# Patient Record
Sex: Male | Born: 1990 | Race: White | Hispanic: No | Marital: Married | Smoking: Never smoker
Health system: Southern US, Community
[De-identification: ages and names within clinical notes are randomized; demographics above are authoritative.]

## PROBLEM LIST (undated history)

## (undated) DIAGNOSIS — F909 Attention-deficit hyperactivity disorder, unspecified type: Secondary | ICD-10-CM

## (undated) DIAGNOSIS — Z87898 Personal history of other specified conditions: Secondary | ICD-10-CM

## (undated) DIAGNOSIS — N201 Calculus of ureter: Secondary | ICD-10-CM

## (undated) DIAGNOSIS — Z973 Presence of spectacles and contact lenses: Secondary | ICD-10-CM

## (undated) DIAGNOSIS — K219 Gastro-esophageal reflux disease without esophagitis: Secondary | ICD-10-CM

## (undated) HISTORY — DX: Attention-deficit hyperactivity disorder, unspecified type: F90.9

## (undated) HISTORY — PX: EXTRACORPOREAL SHOCK WAVE LITHOTRIPSY: SHX1557

---
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
## 2010-11-01 ENCOUNTER — Encounter: Payer: Self-pay | Admitting: Urology

## 2011-02-26 NOTE — H&P (Signed)
NAME:  Chris Murphy, Chris Murphy             ACCOUNT NO.:  [id number]   MEDICAL RECORD NO.:  [id number]          PATIENT TYPE:  AMB   LOCATION:  DAY                           FACILITY:  [facility]   PHYSICIAN:  Dennie Maizes, M.D.   DATE OF BIRTH:  1991-01-15   DATE OF ADMISSION:  07/08/2004  DATE OF DISCHARGE:  LH                                HISTORY & PHYSICAL   CHIEF COMPLAINT:  Right flank pain, mild hematuria.   HISTORY OF PRESENT ILLNESS:  This 20 year old boy was referred to me by Dr.  Stephania Fragmin.  He had been having intermittent right flank pain of moderate severity  for several months.  He also has past dark-colored urine.  He has urinary  frequency x4, and nocturia x0-1.  There is no history of fever, chills or  dysuria.  Evaluation has been done with non-contrast CT of the abdomen and  pelvis as well as plain x-ray of the KUB area.  There are serial bilateral  renal calculi.  The patient has a 6- x 8-mm sized right renal calculus.  He  has intermittent right flank pain associated with the calculus.  He was  brought to the day hospital today for ESWL of the right renal calculus.   PAST MEDICAL HISTORY:  1.  History of attention-deficit disorder.  2.  ACL injury on the right knee.   MEDICATIONS:  1.  Concerta.  2.  Claritin.   ALLERGIES:  None.   OPERATIONS:  None.   FAMILY HISTORY:  Unremarkable.   PHYSICAL EXAMINATION:  VITAL SIGNS:  Height 5 feet, 9 inches.  Weight 131  pounds.  HEENT:  Head, eyes, ears, nose and throat normal.  NECK:  No masses.  LUNGS:  Clear to auscultation.  HEART:  Regular rate and rhythm, no murmur.  ABDOMEN:  Soft, no palpable flank mass.  Mild right costovertebral angle  tenderness is noted.  Bladder is not palpable.  GU:  Penis and testes are normal.   IMPRESSION:  1.  Right flank pain.  2.  Hematuria.  3.  Right renal calculus.   PLAN:  1.  Extracorporeal shockwave lithotripsy of right renal calculus with IV      sedation, in the hospital.  2.   I discussed with the patient and his mother regarding the diagnosis,      operative details, alternative treatments, outcome, possible risks and      complications, and they are agreeable for the procedure to be done.      SK/MEDQ  D:  07/07/2004  T:  07/07/2004  Job:  [id number]   cc:   Francoise Schaumann. Halm, D.O.  [address], [address]  [place]  [place] [address]  Fax: [phone]   [facility]  Fax: [phone]

## 2011-02-26 NOTE — Discharge Summary (Signed)
NAME:  Chris Murphy, Chris Murphy             ACCOUNT NO.:  [id number]   MEDICAL RECORD NO.:  [id number]          PATIENT TYPE:  INP   LOCATION:  A316                          FACILITY:  [facility]   PHYSICIAN:  Scott A. Gerda Diss, MD    DATE OF BIRTH:  1991-08-04   DATE OF ADMISSION:  01/29/2005  DATE OF DISCHARGE:  04/22/2006LH                                 DISCHARGE SUMMARY   DISCHARGE DIAGNOSES:  1.  Vomiting secondary to reflux.  2.  Seizures.   Patient had a seizure that happened four separate times spaced about over a  3-hour span, each seizure lasted about 3-4 minutes with severe postictal  phase in between the seizures.  There was some question whether or not this  might have been just one long prolonged atypical seizure.  He has not had  any head injury recently, he has not had headaches recently, no fevers, no  tick bites.  He was admitted in by Dr. Milinda Cave and on Saturday he was stable  without seizure.  His CT scan did not show any lesions.  EEG was unable to  be done because of unavailability of test.  Long discussion held with family  regarding what to do for a seizure.  They felt very comfortable in taking  him home and treating him accordingly if he did have a seizure by bringing  him back via EMS to the [facility].  They also have an outpatient  appointment with Dr. Gerilyn Pilgrim on Tuesday.  They were told that they will  probably need to get an EEG and they may well also have to get an MRI done  and then based on that they would decide long-term medications.  They were  also instructed to follow up with Dr. Milinda Cave within the next 2 weeks.  Also it should be noted on his right leg he had a small dot with a little  red around it about the size of a pencil eraser.  I told them to watch this.  If it becomes a target lesion certainly Lyme's related illness would need to  be covered for.  We will leave that up to Dr. Milinda Cave and Dr. Gerilyn Pilgrim.      SAL/MEDQ  D:  01/31/2005  T:   01/31/2005  Job:  [id number]   cc:   Darleen Crocker A. Gerilyn Pilgrim, M.D.  [address] [place]  [place]  [place] [address]  Fax: [phone]   Jeoffrey Massed, MD  [address]  [place]  [place] [address]  Fax: [phone]

## 2011-02-26 NOTE — Discharge Summary (Signed)
NAME:  Chris Murphy, Chris Murphy NO.:  [id number]   MEDICAL RECORD NO.:  [id number]          PATIENT TYPE:  INP   LOCATION:  A316                          FACILITY:  [facility]   PHYSICIAN:  Jeoffrey Massed, MD  DATE OF BIRTH:  Oct 01, 1991   DATE OF ADMISSION:  01/29/2005  DATE OF DISCHARGE:  04/22/2006LH                                 DISCHARGE SUMMARY   ADMISSION DIAGNOSIS:  Suspected seizure.   DISCHARGE DIAGNOSIS:  Suspected seizure.   DISCHARGE MEDICATIONS:  None.   CONSULTATIONS:  None.   PROCEDURE:  None.   HISTORY OF PRESENT ILLNESS:  For complete history and physical, please see  dictated history and physical in chart. Briefly, this is a 20 year old white  male who presented to the emergency department with his parents for having  brief periods of convulsive activity in the midst of a vomiting episode. He  was noted to be drowsy and confused after these episodes. He was evaluated  in the emergency department where his vital signs were normal and he was at  that time, alert and oriented to person, place, time and situation. He had a  normal neurologic examination and had complete blood work and CT scan of the  head. This evaluation did not reveal any abnormalities with the exception of  a white blood cell count of 18,000. He was admitted for observation for  suspected seizures.   HOSPITAL COURSE:  The patient was admitted to 3-A and placed on seizure  precautions and continued on his home medications of Ritalin LA and a proton  pump inhibitor. He was not placed on any anti-seizure medications. He felt  normal throughout the hospitalization and had no seizure activity. He ate  normally. He was discharged home the day after admission and parents were  offered Dia-STAT to use as needed for prolonged seizure activity but they  declined this. During the hospitalization an EEG was ordered but apparently  there is no technician to do this at [facility] anymore.  Therefore,  the EEG was set up to be done at Dr. Ronal Fear office. He has a neurology  consultation at Dr. Ronal Fear office on the morning of February 02, 2005.      PHM/MEDQ  D:  02/01/2005  T:  02/01/2005  Job:  [id number]   cc:   Darleen Crocker A. Gerilyn Pilgrim, M.D.  [address] [place]  [place]  [place] [address]  Fax: [phone]

## 2011-02-26 NOTE — H&P (Signed)
NAME:  Chris Murphy, Chris Murphy NO.:  [id number]   MEDICAL RECORD NO.:  [id number]          PATIENT TYPE:  AMB   LOCATION:  DAY                           FACILITY:  [facility]   PHYSICIAN:  Dennie Maizes, M.D.   DATE OF BIRTH:  12-01-1990   DATE OF ADMISSION:  07/07/2005  DATE OF DISCHARGE:  LH                                HISTORY & PHYSICAL   CHIEF COMPLAINT:  Intermittent left flank pain, left renal calculi.   HISTORY OF PRESENT ILLNESS:  This 20 year old male has history of recurrent  urolithiasis.  He has undergone ESWL for right renal calculus last year.  Has been having intermittent left flank pain.  His recent x-ray revealed two  stones in the left kidney measuring 6 mm and 5 mm in size.  The patient  denied having any fever, chills, voiding difficulty or hematuria.  He is  brought to short-stay center today for extracorporeal shock wave lithotripsy  of left renal calculus.   PAST MEDICAL HISTORY:  1.  History of attention deficit disorder.  2.  History of injury to the right knee.  3.  Status post ESWL of right renal calculus in September 2005.   MEDICATIONS:  Concerta and Claritin.   ALLERGIES:  None.   FAMILY HISTORY:  Unremarkable.   PHYSICAL EXAMINATION:  VITAL SIGNS:  Height 5 feet 9 inches, weight 131  pounds.  HEENT:  Normal.  NECK:  No masses.  LUNGS:  Clear to auscultation.  HEART:  Regular rate and rhythm. No murmurs.  ABDOMEN:  Soft.  No palpable flank masses.  No costovertebral angle  tenderness.  Bladder is not palpable.  Penis and testes are normal.   IMPRESSION:  Left renal calculi, left flank pain.   PLAN:  1.  Extracorporeal shock wave lithotripsy of the left renal calculi with      monitored anesthesia care in short-stay center.  I have discussed with      the patient and his mother regarding the diagnosis, operative details,      alternate treatments, outcome, possible risks and complications and they      have agreed for the procedure  to be done.      Dennie Maizes, M.D.  Electronically Signed     SK/MEDQ  D:  07/07/2005  T:  07/07/2005  Job:  [id number]   cc:   Francoise Schaumann. Halford Chessman  Fax: [phone]

## 2011-02-26 NOTE — H&P (Signed)
NAME:  BARNES, FLOREK NO.:  [id number]   MEDICAL RECORD NO.:  [id number]          PATIENT TYPE:  INP   LOCATION:  A316                          FACILITY:  [facility]   PHYSICIAN:  Jeoffrey Massed, MD  DATE OF BIRTH:  [birth year]   DATE OF ADMISSION:  01/29/2005  DATE OF DISCHARGE:  LH                                HISTORY & PHYSICAL   CHIEF COMPLAINT:  Seizure.   PRIMARY CARE PHYSICIAN:  Francoise Schaumann. Halm, DO, FAAP, FACOP/Philip Hoover Browns,  M.D.   HISTORY OF PRESENT ILLNESS:  Chris Murphy is a 20 year old white male with a  history of severe gastroesophageal reflux disease who was in his normal  state of health until approximately 3:45 a.m. the morning of presentation.  At that point, he began having one of his typical episodes of reflux which  consisted of regurgitation of fluid into the mouth, and vomiting, and  retching.  He was having abdominal pain during the retching.  During these  episodes of retching, he was noted to have distinct episodes during which he  stiffened his entire body, turned both wrists inward, and clenched his  fists, and had some generalized shaking activity, and his eyes went back in  his head.  He was sitting on the couch at the time these occurred, and his  mother says that last approximately 10-20 seconds.  There were about 10-15  minutes between episodes and a total number of episodes was four. After each  episode, he was confused and did not remember what happened.  He was, in  fact, slow in his thinking and slightly lethargic on presentation to the  emergency department.   REVIEW OF SYSTEMS:  No fevers, no headaches, no recent URI or cough.  No  diarrhea or constipation.  No rash.  No recent travel.  No joint or muscle  aches.  He did have an insect bite on his distal right anterior thigh.   PAST MEDICAL HISTORY:  1.  NO HISTORY OF SEIZURE DISORDER.  2.  ADHD, well-controlled on Ritalin LA 90 mg q.d. for the last 2 years.  3.  Severe  GERD, relatively well controlled on Prevacid 30 mg per day.  4.  Kidney stones, asymptomatic since lithotripsy on the right side in      September 2005.   PAST SURGICAL HISTORY:  1.  Right knee surgery several years ago, secondary to sports injury.  2.  Lithotripsy, September 2005.   MEDICATIONS:  1.  Ritalin LA 30 mg tabs, 3 tablets in the each morning.  2.  Prevacid 30 mg p.o. q.d.   ALLERGIES:  No known drug allergies.   SOCIAL HISTORY:  Lives with his parents in [place] and he does not used  tobacco, alcohol or drugs.   FAMILY HISTORY:  No history of seizure disorder.   PHYSICAL EXAM:  VITAL SIGNS:  Temperature normal as were the other vital  signs.  GENERAL:  He was alert and oriented to person, place, time, and situation.  He demonstrated lucid thinking.  HEENT:  Pupils were equal, round, react to light accommodation.  His  extraocular movements are intact. There is no scleral icterus or injection.  Tympanic membranes with good light reflex and landmarks bilaterally.  His  nasal passages are patent bilaterally without drainage.  His oropharynx  reveals pink, moist mucosa without lesion or swelling.  Face is notable for  moderately severe, comedonal acne.  NECK:  Supple without thyromegaly or lymphadenopathy.  There is no neck  tenderness or rigidity.  LUNGS:  Clear to auscultation bilaterally with non labored respirations.  CARDIOVASCULAR:  Shows a regular rhythm and rate without murmur, rub or  gallop.  ABDOMEN:  Soft, nontender, nondistended.  His bowel sounds are normoactive  and there is no hepatosplenomegaly or mass.  EXTREMITIES:  Show no clubbing, cyanosis, or edema.  SKIN:  Shows no rash.   LABS:  CBC was normal with the exception of a white blood cell count of  about 18,000 with slightly elevated neutrophil percentage.  C-MET within  normal limits.  CT scan of the head was normal   ASSESSMENT:  Seizures, appear idiopathic at this point.   PLAN:  1.   Admit for observation.  2.  Setup EEG and arrange for outpatient neurologic consultation.  3.  Will continue Ritalin in the hospital, but the possible lowering of the      seizure threshold by this medication was discussed with the parents in      depth.      PHM/MEDQ  D:  01/29/2005  T:  01/29/2005  Job:  [id number]

## 2011-02-26 NOTE — Op Note (Signed)
[facility]  Patient:    Chris Murphy, Chris Murphy Visit Number: [id number] MRN: [id number]          Service Type: DSU Location: [facility] Attending Physician:  Colbert Ewing Dictated by:   Loreta Ave, M.D. Proc. Date: 06/28/01 Admit Date:  06/28/2001                             Operative Report  PREOPERATIVE DIAGNOSIS:  Partial anterior cruciate ligament tear with locked right knee.  POSTOPERATIVE DIAGNOSIS:  Partial anterior cruciate ligament tear with locked right knee with anterior synovitis, flap tear posterior horn, lateral meniscus, and traumatic chondral abrasion medial femoral condyle.  Partial ACL strain injury without resulting significant instability.  OPERATION PERFORMED:  Right knee examination under anesthesia, arthroscopy with partial synovectomy, debridement, lateral meniscus, assessment debridement of partial anterior cruciate ligament tear.  Chondroplasty of medial femoral condyle.  SURGEON:  Loreta Ave, M.D.  ASSISTANT:  Arlys John D. Petrarca, P.A.-C.  ANESTHESIA:  General.  ESTIMATED BLOOD LOSS:  Minimal.  TOURNIQUET TIME:  None.  ____________  CULTURES:  None.  COMPLICATIONS:  None.  DRESSING:  Soft compressive with knee immobilizer in full extension.  DESCRIPTION OF PROCEDURE:  The patient was brought to the operating room and after adequate anesthesia had been obtained, the right knee examined.  Preop 15 degree flexion contracture was easily correctable to about five degrees in full extension, still with a soft end point.  Negative Lachman, negative drawer with good end point.  Other ligaments stable in full flexion. Tourniquet and leg holder applied.  Leg prepped and draped in the usual sterile fashion.  Three portals created, one superolateral, one each medial and lateral parapatellar.  Inflow catheter introduced, knee distended. Arthroscope introduced, knee inspected.  Posttraumatic synovitis which  was the main block to extension.  All of this cleared out from the front of the knee. Midsubstance anterior cruciate ligament, partial stretch in the midsubstance. Some small flaps off of this debrided.  More than 80% of the ligament remaining with good integrity and end point.  No further anterior impingement after synovectomy and debridement of ACL.  Lateral meniscus and a small flap tear off the posterior horn debrided out with shaver tapered in smoothly.  The medial femoral condyle had a very small focal grade 2 chondral flap, traumatic in nature.  This was debrided with a chondroplasty to a stable surface. For the most part,  remaining articular cartilage excellent and very little loss of articular cartilage from his injury.  Medial meniscus without tears.  PCL intact.  Good patellofemoral tracking.  Entire knee examined, no other significant findings appreciated.  Nice full easy extension, slight hyperextension and completion which matched the other knee.  After completion and instruments and fluid removed, the knee was injected with Marcaine. Portals closed with nylon.  Sterile compressive dressing applied.  Leg holder removed.  Knee immobilizer to hold him in full extension.  Anesthesia reversed.  Brought to recovery room.  Tolerated surgery well.  No complications. Dictated by:   Loreta Ave, M.D. Attending Physician:  Colbert Ewing DD:  06/28/01 TD:  06/28/01 Job: [id number]

## 2013-03-08 ENCOUNTER — Encounter: Payer: Self-pay | Admitting: *Deleted

## 2013-03-16 ENCOUNTER — Encounter: Payer: Self-pay | Admitting: Family Medicine

## 2013-03-16 ENCOUNTER — Ambulatory Visit (INDEPENDENT_AMBULATORY_CARE_PROVIDER_SITE_OTHER): Payer: BC Managed Care – PPO | Admitting: Family Medicine

## 2013-03-16 ENCOUNTER — Ambulatory Visit: Payer: Self-pay | Admitting: Family Medicine

## 2013-03-16 VITALS — BP 118/80 | HR 70 | Wt 256.0 lb

## 2013-03-16 DIAGNOSIS — F909 Attention-deficit hyperactivity disorder, unspecified type: Secondary | ICD-10-CM

## 2013-03-16 NOTE — Patient Instructions (Signed)
Take all medication regularly

## 2013-03-16 NOTE — Progress Notes (Signed)
  Subjective:    Patient ID: Chris Murphy, male    DOB: Feb 02, 1991, 22 y.o.   MRN: [id number]  HPI Patient states overall doing well with medication. No obvious side effects. Definitely helping him. He takes on weekends 2. Notes improved concentration. States he definitely needs to stay on it.   Review of Systems No chest pain no abdominal pain no shortness of breath review systems otherwise negative    Objective:   Physical Exam Alert no acute distress. HEENT normal. Lungs clear. Heart regular rate and rhythm. Neuro intact.       Assessment & Plan:  Impression ADHD good control. Plan recheck in 4 months. 4 months worth of medications written. WSL

## 2013-03-18 DIAGNOSIS — F909 Attention-deficit hyperactivity disorder, unspecified type: Secondary | ICD-10-CM | POA: Insufficient documentation

## 2013-07-02 ENCOUNTER — Telehealth: Payer: Self-pay | Admitting: Family Medicine

## 2013-07-02 NOTE — Telephone Encounter (Signed)
Pt needs a letter on letterhead stating what they need from the letter from [facility] attached, please call when done.

## 2013-07-06 NOTE — Telephone Encounter (Signed)
Misty Stanley called pt to inform note is ready for pick up, pt understands

## 2013-07-11 ENCOUNTER — Telehealth: Payer: Self-pay | Admitting: Family Medicine

## 2013-07-11 NOTE — Telephone Encounter (Addendum)
Patient advised to contact employer and workers comp doctor for evaluation for fracture

## 2013-07-11 NOTE — Telephone Encounter (Signed)
Patient says yesterday he was working at [employer] and a huge bar fell on his foot. He can walk, but is hobbling. It is swollen and bruised. He would like to find out how he would know if it was broke?

## 2013-07-11 NOTE — Telephone Encounter (Signed)
agree

## 2013-07-19 ENCOUNTER — Encounter: Payer: Self-pay | Admitting: Family Medicine

## 2013-07-19 ENCOUNTER — Ambulatory Visit (INDEPENDENT_AMBULATORY_CARE_PROVIDER_SITE_OTHER): Payer: BC Managed Care – PPO | Admitting: Family Medicine

## 2013-07-19 VITALS — BP 122/82 | Ht 73.0 in | Wt 271.5 lb

## 2013-07-19 DIAGNOSIS — F909 Attention-deficit hyperactivity disorder, unspecified type: Secondary | ICD-10-CM

## 2013-07-19 NOTE — Progress Notes (Signed)
  Subjective:    Patient ID: Chris Murphy, male    DOB: Dec 11, 1990, 22 y.o.   MRN: [id number]  HPI Patient is here today for his ADHD follow up visit. Patient states that he has no concerns and is doing very well. Takes concerta in the late after, tends to lslow down. Overall doing well. Medicine definitely helping. Minimal side effects at this time.  Go the gym twice per wk  Regular exercise,  Not a flu shot person   Review of Systems No headache no chest pain no abdominal pain no diminished energy good appetite ROS otherwise negative    Objective:   Physical Exam  Alert no apparent distress lungs clear. Heart regular in rhythm. H&T normal.      Assessment & Plan:  ADHD good control. Tolerating medicines well. Plan maintain same meds. Prescriptions written out. Recheck as scheduled. WSL

## 2013-09-05 ENCOUNTER — Encounter: Payer: Self-pay | Admitting: Family Medicine

## 2013-09-05 ENCOUNTER — Ambulatory Visit (INDEPENDENT_AMBULATORY_CARE_PROVIDER_SITE_OTHER): Payer: BC Managed Care – PPO | Admitting: Family Medicine

## 2013-09-05 VITALS — BP 114/80 | Ht 73.0 in | Wt 277.0 lb

## 2013-09-05 DIAGNOSIS — T148XXA Other injury of unspecified body region, initial encounter: Secondary | ICD-10-CM

## 2013-09-05 DIAGNOSIS — T07XXXA Unspecified multiple injuries, initial encounter: Secondary | ICD-10-CM

## 2013-09-05 NOTE — Progress Notes (Signed)
   Subjective:    Patient ID: Chris Murphy, male    DOB: 1991-08-18, 22 y.o.   MRN: [id number]  Rash This is a new problem. The current episode started yesterday. The problem is unchanged. The affected locations include the chest, right arm, left arm, abdomen and back. The rash is characterized by redness. He was exposed to nothing. Past treatments include nothing. The treatment provided no relief.   PMH benign patient denies any current sickness illness denies any excessive stress he enjoys his job enjoys his home life   Review of Systems  Skin: Positive for rash.       Objective:   Physical Exam  Unusual scratches on the chest around the neck and upper back none on the face. Patient states he had a disturbing dream a few nights ago he denies being depressed or suicidal or homicidal. He denies any other particular troubles currently      Assessment & Plan:  Scratches-I. believe that he probably did this and during his sleep he doesn't recall any other way of getting it he has not been outside doing any type of activity I told him if he has ongoing troubles to followup. Also if he has ongoing disturbing dreams or ongoing scratching we may consider setting him up with psychology for some counseling I find no evidence of scabies

## 2013-11-21 ENCOUNTER — Encounter: Payer: Self-pay | Admitting: Family Medicine

## 2013-11-21 ENCOUNTER — Ambulatory Visit (INDEPENDENT_AMBULATORY_CARE_PROVIDER_SITE_OTHER): Payer: BC Managed Care – PPO | Admitting: Family Medicine

## 2013-11-21 VITALS — BP 130/80 | Ht 73.0 in | Wt 276.0 lb

## 2013-11-21 DIAGNOSIS — F909 Attention-deficit hyperactivity disorder, unspecified type: Secondary | ICD-10-CM

## 2013-11-21 MED ORDER — METHYLPHENIDATE HCL ER (OSM) 54 MG PO TBCR: 54.0000 mg | EXTENDED_RELEASE_TABLET | ORAL | Status: DC

## 2013-11-21 NOTE — Progress Notes (Signed)
   Subjective:    Patient ID: Chris Murphy, male    DOB: 08-07-1991, 23 y.o.   MRN: [id number]  HPI Patient arrives for an ADHD checkup.  Takes med day and weekend   Patient states he is doing well with no problems or concerns.  And taking regularly  No s e's not any significant  Exercise not as much as usual, once per wk  Review of Systems No chest pain no headache no back pain no change in bowel habits no rash ROS otherwise negative    Objective:   Physical Exam Alert no apparent distress. Vitals stable. HEENT normal. Lungs clear. Heart regular in rhythm. Neuro intact.       Assessment & Plan:  Impression 1 ADHD good control plan meds refilled. Recheck in 4 months. Exercise encourage. WSL

## 2014-03-29 ENCOUNTER — Ambulatory Visit (INDEPENDENT_AMBULATORY_CARE_PROVIDER_SITE_OTHER): Payer: BC Managed Care – PPO | Admitting: Family Medicine

## 2014-03-29 ENCOUNTER — Encounter: Payer: Self-pay | Admitting: Family Medicine

## 2014-03-29 VITALS — BP 122/86 | Ht 73.0 in | Wt 279.0 lb

## 2014-03-29 DIAGNOSIS — F902 Attention-deficit hyperactivity disorder, combined type: Secondary | ICD-10-CM

## 2014-03-29 DIAGNOSIS — F909 Attention-deficit hyperactivity disorder, unspecified type: Secondary | ICD-10-CM

## 2014-03-29 MED ORDER — METHYLPHENIDATE HCL ER (OSM) 54 MG PO TBCR: 54.0000 mg | EXTENDED_RELEASE_TABLET | ORAL | Status: DC

## 2014-03-29 NOTE — Progress Notes (Signed)
   Subjective:    Patient ID: Chris Murphy, male    DOB: 12-Apr-1991, 23 y.o.   MRN: [id number]  HPI Patient is here today for a med refill on his Concerta.    Attn and focucing   Takes on the weekends  No notoiceable s es  Exercise has recently started     Medication is working well for him.   He has no questions/concerns.   Review of Systems No headache no chest pain no back pain no rash no insomnia no abdominal pain    Objective:   Physical Exam Alert no acute distress. Vitals stable. HEENT normal. Lungs clear. Heart regular rate and rhythm. Neuro intact       Assessment & Plan:  Impression ADHD good control discussed plan maintain same meds. Diet exercise discussed. Recheck in 4 months. WSL

## 2014-05-24 ENCOUNTER — Telehealth: Payer: Self-pay | Admitting: Family Medicine

## 2014-05-24 NOTE — Telephone Encounter (Signed)
Error

## 2014-06-03 ENCOUNTER — Telehealth: Payer: Self-pay | Admitting: Family Medicine

## 2014-06-03 NOTE — Telephone Encounter (Signed)
Rx prior auth APPROVED for pt's METHYLPHENIDATE HCl 54mg  (generice Concerta), prior auth good 05/13/14-06/03/15, faxed approval to [facility], notified pt

## 2014-06-06 ENCOUNTER — Ambulatory Visit (INDEPENDENT_AMBULATORY_CARE_PROVIDER_SITE_OTHER): Payer: BC Managed Care – PPO | Admitting: Family Medicine

## 2014-06-06 ENCOUNTER — Encounter: Payer: Self-pay | Admitting: Family Medicine

## 2014-06-06 VITALS — BP 122/70 | Temp 98.9°F | Ht 73.0 in | Wt 276.6 lb

## 2014-06-06 DIAGNOSIS — R300 Dysuria: Secondary | ICD-10-CM

## 2014-06-06 DIAGNOSIS — R1012 Left upper quadrant pain: Secondary | ICD-10-CM

## 2014-06-06 DIAGNOSIS — N200 Calculus of kidney: Secondary | ICD-10-CM | POA: Insufficient documentation

## 2014-06-06 LAB — POCT URINALYSIS DIPSTICK
Spec Grav, UA: 1.015
pH, UA: 6

## 2014-06-06 MED ORDER — ETODOLAC 400 MG PO TABS: 400.0000 mg | ORAL_TABLET | Freq: Two times a day (BID) | ORAL | Status: DC

## 2014-06-06 NOTE — Progress Notes (Signed)
   Subjective:    Patient ID: ALI MOHL, male    DOB: 02-08-1991, 23 y.o.   MRN: [id number]  HPI  Patient arrives with complaint of left side abd pain a few times in last 1.5 months. Patient wonders if it is kidney stones.  After dinner pt experienced horibble abd pain  Pain was severe enough to to er, but on the way vomited and felt better  Then vomitied, and felt better  Then in mid July , [place], bad low abd ache and uncomfortable, no nausea that day  yest pain hit again and severe, lasted a couple of hrs  Came bk again in the eve  Results for orders placed in visit on 06/06/14  POCT URINALYSIS DIPSTICK      Result Value Ref Range   Color, UA       Clarity, UA       Glucose, UA       Bilirubin, UA       Ketones, UA       Spec Grav, UA 1.015     Blood, UA       pH, UA 6.0     Protein, UA       Urobilinogen, UA       Nitrite, UA       Leukocytes, UA       no family history of kidney stones.  All pain occurred on the left side. At times severe. Sometimes associated nausea and vomiting.   Review of Systems No headache no chest pain no fever no chills no major change in urinary habits occasional loose stools ROS otherwise negative.    Objective:   Physical Exam  Alert no apparent distress. Lungs clear. Heart regular in rhythm. H&T normal. Abdomen left lateral abdominal tenderness to deep palpation. Next  Urinalysis 2-4 red blood cells per high-power field      Assessment & Plan:  Impression probable kidney stone doubt ongoing obstruction. Discussed at length. Since CT scan already done do not feel another is warranted at this time. Plan Zofran when necessary patient Selena Batten has some. Increase fluid intake. Screening urine. Anti-inflammatory prescribed. If persists return for further evaluation. WSL

## 2014-06-07 ENCOUNTER — Other Ambulatory Visit: Payer: Self-pay

## 2014-06-07 ENCOUNTER — Telehealth: Payer: Self-pay | Admitting: Family Medicine

## 2014-06-07 DIAGNOSIS — N200 Calculus of kidney: Secondary | ICD-10-CM

## 2014-06-07 NOTE — Telephone Encounter (Signed)
Patient has asked that Dr. Richardson Landry call him back regarding the issue that he spoke with him about earlier.

## 2014-06-12 ENCOUNTER — Encounter: Payer: Self-pay | Admitting: Family Medicine

## 2014-06-25 ENCOUNTER — Other Ambulatory Visit: Payer: Self-pay | Admitting: Urology

## 2014-06-27 ENCOUNTER — Encounter (HOSPITAL_COMMUNITY): Payer: Self-pay | Admitting: *Deleted

## 2014-06-27 ENCOUNTER — Encounter (HOSPITAL_COMMUNITY): Payer: Self-pay | Admitting: Pharmacy Technician

## 2014-07-01 ENCOUNTER — Ambulatory Visit (HOSPITAL_COMMUNITY): Payer: BC Managed Care – PPO

## 2014-07-01 ENCOUNTER — Encounter (HOSPITAL_COMMUNITY): Payer: Self-pay | Admitting: *Deleted

## 2014-07-01 ENCOUNTER — Ambulatory Visit (HOSPITAL_COMMUNITY)
Admission: RE | Admit: 2014-07-01 | Discharge: 2014-07-01 | Disposition: A | Discharge status: Home / Self Care | Payer: BC Managed Care – PPO | Source: Ambulatory Visit | Attending: Urology | Admitting: Urology

## 2014-07-01 ENCOUNTER — Encounter (HOSPITAL_COMMUNITY)
Admission: RE | Disposition: A | Discharge status: Home / Self Care | Payer: Self-pay | Source: Ambulatory Visit | Attending: Urology

## 2014-07-01 DIAGNOSIS — N201 Calculus of ureter: Secondary | ICD-10-CM | POA: Insufficient documentation

## 2014-07-01 DIAGNOSIS — R569 Unspecified convulsions: Secondary | ICD-10-CM | POA: Insufficient documentation

## 2014-07-01 DIAGNOSIS — Z538 Procedure and treatment not carried out for other reasons: Secondary | ICD-10-CM | POA: Diagnosis not present

## 2014-07-01 DIAGNOSIS — R110 Nausea: Secondary | ICD-10-CM | POA: Diagnosis not present

## 2014-07-01 SURGERY — LITHOTRIPSY, ESWL
Anesthesia: LOCAL | Laterality: Left

## 2014-07-01 MED ORDER — CIPROFLOXACIN HCL 500 MG PO TABS
500.0000 mg | ORAL_TABLET | ORAL | Status: AC
  Administered 2014-07-01: 500 mg via ORAL
  Filled 2014-07-01: qty 1

## 2014-07-01 MED ORDER — DIAZEPAM 5 MG PO TABS
10.0000 mg | ORAL_TABLET | ORAL | Status: AC
  Administered 2014-07-01: 10 mg via ORAL
  Filled 2014-07-01: qty 2

## 2014-07-01 MED ORDER — SODIUM CHLORIDE 0.9 % IV SOLN
INTRAVENOUS | Status: DC
  Administered 2014-07-01: 1000 mL via INTRAVENOUS

## 2014-07-01 MED ORDER — DIPHENHYDRAMINE HCL 25 MG PO CAPS
25.0000 mg | ORAL_CAPSULE | ORAL | Status: AC
  Administered 2014-07-01: 25 mg via ORAL
  Filled 2014-07-01: qty 1

## 2014-07-01 NOTE — Progress Notes (Signed)
Received call from [place] on [employer] [employer] truck that Dr Junious Silk wanted to discussion with anesthesia

## 2014-07-01 NOTE — Progress Notes (Signed)
Again spoke with Mom,patients wife and patient and they states that from age [age] patient would have a seizure whenever he vomited but not always,The most recent episode was May 2015 with his kidney stone in which the wife states she witnessed him  "having a seizure when he was vomiting". This was reported to [place] on [place] truck and she was reporting this to Dr Junious Silk and Anesthesia.

## 2014-07-01 NOTE — Progress Notes (Signed)
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 She wanted to call Dr Junious Silk about this.

## 2014-07-01 NOTE — Progress Notes (Addendum)
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

## 2014-07-01 NOTE — Progress Notes (Signed)
Dr Junious Silk here to see patient and family. Pt is canceled and to be rescheduled at a future date

## 2014-07-01 NOTE — H&P (Signed)
  Patient was being considered today for left shockwave lithotripsy of a large ureteral calculus. It became apparent that he had significant issues with nausea leading to seizures. Anesthesia was available for the procedure but recovery room staff was not available. I discussed the situation with the patient, his wife and mother. Discussed alternatives such as ureteroscopy and again he does not want to proceed with endoscopic treatment. He will be scheduled for shockwave lithotripsy earlier during the day with anesthesia when recovery room will be fully staffed for postoperative observation.

## 2014-07-02 ENCOUNTER — Other Ambulatory Visit: Payer: Self-pay | Admitting: Urology

## 2014-07-02 ENCOUNTER — Encounter (HOSPITAL_COMMUNITY): Payer: Self-pay | Admitting: *Deleted

## 2014-07-04 ENCOUNTER — Encounter (HOSPITAL_COMMUNITY): Payer: Self-pay

## 2014-07-04 ENCOUNTER — Encounter (HOSPITAL_COMMUNITY): Payer: BC Managed Care – PPO | Admitting: Anesthesiology

## 2014-07-04 ENCOUNTER — Ambulatory Visit (HOSPITAL_COMMUNITY)
Admission: RE | Admit: 2014-07-04 | Discharge: 2014-07-04 | Disposition: A | Discharge status: Home / Self Care | Payer: BC Managed Care – PPO | Source: Ambulatory Visit | Attending: Urology | Admitting: Urology

## 2014-07-04 ENCOUNTER — Ambulatory Visit (HOSPITAL_COMMUNITY): Payer: BC Managed Care – PPO | Admitting: Anesthesiology

## 2014-07-04 ENCOUNTER — Encounter (HOSPITAL_COMMUNITY)
Admission: RE | Disposition: A | Discharge status: Home / Self Care | Payer: Self-pay | Source: Ambulatory Visit | Attending: Urology

## 2014-07-04 ENCOUNTER — Ambulatory Visit (HOSPITAL_COMMUNITY): Payer: BC Managed Care – PPO

## 2014-07-04 DIAGNOSIS — K219 Gastro-esophageal reflux disease without esophagitis: Secondary | ICD-10-CM | POA: Insufficient documentation

## 2014-07-04 DIAGNOSIS — N201 Calculus of ureter: Secondary | ICD-10-CM | POA: Diagnosis present

## 2014-07-04 SURGERY — LITHOTRIPSY, ESWL
Anesthesia: Monitor Anesthesia Care | Laterality: Left

## 2014-07-04 MED ORDER — LACTATED RINGERS IV SOLN: INTRAVENOUS | Status: DC

## 2014-07-04 MED ORDER — LACTATED RINGERS IV SOLN
INTRAVENOUS | Status: DC
  Administered 2014-07-04: 08:00:00 via INTRAVENOUS

## 2014-07-04 MED ORDER — OXYCODONE-ACETAMINOPHEN 5-325 MG PO TABS: 1.0000 | ORAL_TABLET | ORAL | Status: DC | PRN

## 2014-07-04 MED ORDER — CIPROFLOXACIN HCL 500 MG PO TABS
500.0000 mg | ORAL_TABLET | ORAL | Status: AC
  Administered 2014-07-04: 500 mg via ORAL
  Filled 2014-07-04: qty 1

## 2014-07-04 MED ORDER — SODIUM CHLORIDE 0.9 % IV SOLN
50.0000 mg | Freq: Once | INTRAVENOUS | Status: AC
  Administered 2014-07-04: 50 mg via INTRAVENOUS
  Filled 2014-07-04: qty 5

## 2014-07-04 MED ORDER — DIAZEPAM 5 MG PO TABS: 10.0000 mg | ORAL_TABLET | ORAL | Status: DC

## 2014-07-04 MED ORDER — PROMETHAZINE HCL 25 MG/ML IJ SOLN: 6.2500 mg | INTRAMUSCULAR | Status: DC | PRN

## 2014-07-04 MED ORDER — SODIUM CHLORIDE 0.9 % IV SOLN: INTRAVENOUS | Status: DC

## 2014-07-04 MED ORDER — TAMSULOSIN HCL 0.4 MG PO CAPS: 0.4000 mg | ORAL_CAPSULE | Freq: Every day | ORAL | Status: DC

## 2014-07-04 MED ORDER — DIPHENHYDRAMINE HCL 25 MG PO CAPS: 25.0000 mg | ORAL_CAPSULE | ORAL | Status: DC

## 2014-07-04 MED ORDER — MEPERIDINE HCL 50 MG/ML IJ SOLN: 6.2500 mg | INTRAMUSCULAR | Status: DC | PRN

## 2014-07-04 NOTE — Interval H&P Note (Signed)
History and Physical Interval Note:  07/04/2014 9:00 AM  Chris Murphy  has presented today for surgery, with the diagnosis of left ureteral stone  The various methods of treatment have been discussed with the patient and family. After consideration of risks, benefits and other options for treatment, the patient has consented to  Procedure(s): LEFT EXTRACORPOREAL SHOCK WAVE LITHOTRIPSY (ESWL) (Left) as a surgical intervention .  The patient's history has been reviewed, patient examined, no change in status, stable for surgery.  I have reviewed the patient's chart and labs.  Questions were answered to the patient's satisfaction.     Festus Aloe

## 2014-07-04 NOTE — Discharge Instructions (Signed)

## 2014-07-04 NOTE — Anesthesia Preprocedure Evaluation (Signed)
Anesthesia Evaluation  Patient identified by MRN, date of birth, ID band Patient awake    Reviewed: Allergy & Precautions, H&P , NPO status , Patient's Chart, lab work & pertinent test results  Airway Mallampati: II TM Distance: >3 FB Neck ROM: Full    Dental no notable dental hx.    Pulmonary neg pulmonary ROS,  breath sounds clear to auscultation  Pulmonary exam normal       Cardiovascular negative cardio ROS  Rhythm:Regular Rate:Normal     Neuro/Psych negative neurological ROS  negative psych ROS   GI/Hepatic negative GI ROS, Neg liver ROS,   Endo/Other  negative endocrine ROS  Renal/GU negative Renal ROS  negative genitourinary   Musculoskeletal negative musculoskeletal ROS (+)   Abdominal   Peds negative pediatric ROS (+)  Hematology negative hematology ROS (+)   Anesthesia Other Findings   Reproductive/Obstetrics negative OB ROS                           Anesthesia Physical Anesthesia Plan  ASA: I  Anesthesia Plan: MAC   Post-op Pain Management:    Induction:   Airway Management Planned: Simple Face Mask  Additional Equipment:   Intra-op Plan:   Post-operative Plan:   Informed Consent: I have reviewed the patients History and Physical, chart, labs and discussed the procedure including the risks, benefits and alternatives for the proposed anesthesia with the patient or authorized representative who has indicated his/her understanding and acceptance.   Dental advisory given  Plan Discussed with: CRNA  Anesthesia Plan Comments:         Anesthesia Quick Evaluation

## 2014-07-04 NOTE — H&P (Signed)
History of Present Illness Consult for left ureteral stone referred by Dr. Wolfgang Phoenix.     1- left ureteraL stone - Sept 2015 - about 4 months ago pt developed severe left flank pain. A CT A/P was done in [place], [place] showing a 10 mm left proximal stone at L4. Report says no other stones. There was severe left HUN. Ua showed no bacteria, but bun 18, cr 1.4.     Today, the patient continues to have some intermittent left lower quadrant pain which radiates over into the suprapubic area. He has noticed dark yellow urine but no gross hematuria. No dysuria. He has not seen a stone pass.     The patient is here with his mother who contributed to the history.    2-nephrolithiasis-patient has had 2 prior stone episodes treated with shockwave lithotripsy. He was previously seen by Dr. Maryland Pink.    Past Medical History Problems  1. History of esophageal reflux (V12.79)  Surgical History Problems  1. History of Lithotripsy 2. History of Primary Repair Of Knee Ligament Cruciate Anterior  Current Meds 1. Concerta 54 MG Oral Tablet Extended Release;  Therapy: (Recorded:11Sep2015) to Recorded 2. Excedrin TABS; prn;  Therapy: (Recorded:11Sep2015) to Recorded 3. Multi Vitamin/Minerals TABS;  Therapy: (Recorded:11Sep2015) to Recorded  Allergies Medication  1. No Known Drug Allergies  Family History Problems  1. Family history of hypertension (V17.49) : Father 2. Family history of malignant neoplasm of breast (V16.3) : Maternal Grandmother, Paternal  Grandmother  Social History Problems    Denied: History of Alcohol use   Caffeine use (V49.89)   Never a smoker   Occupation  Review of Systems Genitourinary, constitutional, skin, eye, otolaryngeal, hematologic/lymphatic, cardiovascular, pulmonary, endocrine, musculoskeletal, gastrointestinal, neurological and psychiatric system(s) were reviewed and pertinent findings if present are noted.  Gastrointestinal: constipation.  ENT: sore  throat and sinus problems.  Respiratory: cough.    Vitals Vital Signs [Data Includes: Last 1 Day]  Recorded: 11Sep2015 02:46PM  Height: 6 ft  Weight: 265 lb  BMI Calculated: 35.94 BSA Calculated: 2.4 Blood Pressure: 119 / 79 Temperature: 98.5 F Heart Rate: 76  Physical Exam Constitutional: Well nourished and well developed . No acute distress.  Pulmonary: No respiratory distress and normal respiratory rhythm and effort.  Cardiovascular: Heart rate and rhythm are normal . No peripheral edema.  Neuro/Psych:. Mood and affect are appropriate.    Results/Data Urine [Data Includes: Last 1 Day]   [id number]  COLOR YELLOW   APPEARANCE CLEAR   SPECIFIC GRAVITY 1.020   pH 6.5   GLUCOSE NEG mg/dL  BILIRUBIN NEG   KETONE NEG mg/dL  BLOOD TRACE   PROTEIN NEG mg/dL  UROBILINOGEN 0.2 mg/dL  NITRITE NEG   LEUKOCYTE ESTERASE NEG   SQUAMOUS EPITHELIAL/HPF RARE   WBC 3-6 WBC/hpf  RBC 0-2 RBC/hpf  BACTERIA RARE   CRYSTALS NONE SEEN   CASTS NONE SEEN   Other MUCUS NOTED    Old records or history reviewed:Marland Kitchen    Procedure KUB today-there is an 11 mm opacity over the left sacrum consistent with the prior stone on the CT report. I do not appreciate any other calcifications. The bones appeared normal. The bowel gas pattern appear normal.     Assessment Assessed  1. Ureteral calculus, left (592.1) 2. Bacteriuria, asymptomatic (791.9)  Plan Bacteriuria, asymptomatic  1. URINE CULTURE; Status:In Progress - Specimen/Data Collected;   Done: [id number] Health Maintenance  2. UA With REFLEX; [Do Not Release]; Status:Complete;   Done: [id number] 02:36PM Ureteral calculus, left  3. Follow-up Schedule Surgery Office  Follow-up  Status: Complete  Done: [id number] 4. KUB; Status:Resulted - Requires Verification;   Done: [id number] 12:00AM  Discussion/Summary Left ureteral stone-we discussed the nature and risks and benefits of continued stone passage, left shockwave lithotripsy, left ureteroscopy  with laser lithotripsy and stent placement. All questions answered. I discussed given the stone size and location it may limit the ability to localize and deliver shockwaves which may reduce the success of shockwave lithotripsy. He was adamant that he wants to proceed shockwave lithotripsy. He understands this is considered a staged procedure and that he may need repeat shockwave or ureteroscopy if the stone fails to fragment or the fragments fail to pass. I did send urine for culture as a precaution. We also discussed he should proceed to the emergency department if he develops uncontrolled pain, fevers, chills, nausea, vomiting prior to ESWL.     Signatures Electronically signed by : Festus Aloe, M.D.; Jun 21 2014  4:05PM EST   Addendum: The patient was originally scheduled for Monday but with this condition of nausea leading to possible seizures the decision was made to reschedule him when anesthesia and adequate recovery room staff were available.

## 2014-07-04 NOTE — Op Note (Signed)
Left proximal-mid 10 mm ureteral stone  Procedure: Left ESWL, prone, conscious sedation with CRNA.   See [facility] note for full detail

## 2014-07-08 NOTE — Anesthesia Postprocedure Evaluation (Signed)
  Anesthesia Post-op Note  Patient: Chris Murphy  Procedure(s) Performed: Procedure(s) (LRB): LEFT EXTRACORPOREAL SHOCK WAVE LITHOTRIPSY (ESWL) (Left)  Patient Location: PACU  Anesthesia Type: MAC  Level of Consciousness: awake and alert   Airway and Oxygen Therapy: Patient Spontanous Breathing  Post-op Pain: mild  Post-op Assessment: Post-op Vital signs reviewed, Patient's Cardiovascular Status Stable, Respiratory Function Stable, Patent Airway and No signs of Nausea or vomiting  Last Vitals:  Filed Vitals:   07/04/14 1050  BP: 115/72  Pulse: 76  Temp: 36.8 C  Resp: 18    Post-op Vital Signs: stable   Complications: No apparent anesthesia complications

## 2014-08-12 ENCOUNTER — Encounter: Payer: Self-pay | Admitting: Family Medicine

## 2014-08-12 ENCOUNTER — Ambulatory Visit (INDEPENDENT_AMBULATORY_CARE_PROVIDER_SITE_OTHER): Payer: BC Managed Care – PPO | Admitting: Family Medicine

## 2014-08-12 VITALS — BP 118/72 | Ht 73.0 in | Wt 276.0 lb

## 2014-08-12 DIAGNOSIS — F900 Attention-deficit hyperactivity disorder, predominantly inattentive type: Secondary | ICD-10-CM

## 2014-08-12 MED ORDER — METHYLPHENIDATE HCL ER (OSM) 54 MG PO TBCR: 54.0000 mg | EXTENDED_RELEASE_TABLET | ORAL | Status: DC

## 2014-08-12 NOTE — Progress Notes (Signed)
   Subjective:    Patient ID: Chris Murphy, male    DOB: 05-Mar-1991, 23 y.o.   MRN: [id number]  HPI Patient is here today for ADHD check up.  Helping as far as the med  No noticeable side effcts  He needs a refill on his med.  No other concerns.  Also currently don't challenges of kidney stone. Received lithotriptor to therapy but not curative. May need another one soon. Had another attack this weekend   Review of Systems No headache no chest pain no vomiting    Objective:   Physical Exam Alert no apparent distress vital stable HEENT normal. Lungs clear. Heart regular rate and rhythm.       Assessment & Plan:  Impression ADHD good control discussed plan maintain same medications. 4 months worth written. Return then. Diet discussed. WS L

## 2014-08-21 ENCOUNTER — Other Ambulatory Visit: Payer: Self-pay | Admitting: Urology

## 2014-08-29 ENCOUNTER — Encounter (HOSPITAL_BASED_OUTPATIENT_CLINIC_OR_DEPARTMENT_OTHER): Payer: Self-pay | Admitting: *Deleted

## 2014-08-29 NOTE — Progress Notes (Signed)
NPO AFTER MN. ARRIVE AT 0915. NEEDS HG. MAY TAKE PAIN/ NAUSEA RX IF NEEDED W/ SIPS OF WATER AM DOS.

## 2014-08-29 NOTE — H&P (Signed)
Reason For Visit Seeen today for chronic intermittent left flank pain.   Active Problems Problems  1. Left flank pain (R10.9)   Assessed By: Jimmey Ralph (Urology); Last Assessed: 21 Aug 2014 2. Ureteral calculus, left (N20.1)   Assessed By: Jimmey Ralph (Urology); Last Assessed: 21 Aug 2014  History of Present Illness 23 YO male patient of Dr. Lyndal Rainbow seen today for chronic intermittent left flank pain.     GU Hx:     F/u - Left ureteral stone referred by Dr. Wolfgang Phoenix.     1- left ureteraL stone - Sept 2015 - about 4 months ago pt developed severe left flank pain. A CT A/P was done in [place], [place] showing a 10 mm left proximal stone at L4. Report says no other stones. There was severe left HUN. Ua showed no bacteria, but bun 18, cr 1.4.   -Sept 2015 Left ESWL    2-nephrolithiasis-patient has had 2 prior stone episodes treated with shockwave lithotripsy. He was previously seen by Dr. [place] Pink.       Oct 2015 interval hx  Patient returned and said he passed multiple fragments but he was only able to collect one of them. He's had no dysuria or gross hematuria or flank pain. He's had no other stone passage.    Left renal ultrasound shows no significant hydronephrosis but may be some fullness of the pelvis and infundibulum. On KUB there is persistent calcification of the left sacrum.     Nov 2015 Interval Hx:   Today states he continues to have intermittent left flank pain with tea colored urine. Denies passing any stone material since last visit 2 weeks ago. Denies f/c or n/v.   Past Medical History Problems  1. History of esophageal reflux (Z87.19)  Surgical History Problems  1. History of Lithotripsy 2. History of Lithotripsy 3. History of Primary Repair Of Knee Ligament Cruciate Anterior  Current Meds 1. Concerta 54 MG Oral Tablet Extended Release;  Therapy: (Recorded:11Sep2015) to Recorded 2. Excedrin TABS; prn;  Therapy: (Recorded:11Sep2015) to  Recorded  Allergies Medication  1. No Known Drug Allergies  Family History Problems  1. Family history of hypertension (Z82.49) : Father 2. Family history of malignant neoplasm of breast (Z80.3) : Maternal Grandmother, Paternal  Grandmother  Social History Problems  1. Denied: History of Alcohol use 2. Caffeine use (F15.90)   1-2 3. Never a smoker 4. Occupation   customer service  Review of Systems Genitourinary, constitutional, skin, eye, otolaryngeal, hematologic/lymphatic, cardiovascular, pulmonary, endocrine, musculoskeletal, gastrointestinal, neurological and psychiatric system(s) were reviewed and pertinent findings if present are noted and are otherwise negative.  Gastrointestinal: flank pain.    Vitals Vital Signs [Data Includes: Last 1 Day]  Recorded: [id number] 10:13AM  Height: 6 ft 1 in Weight: 270 lb  BMI Calculated: 35.62 BSA Calculated: 2.44 Blood Pressure: 125 / 80 Temperature: 99.1 F Heart Rate: 81  Physical Exam Constitutional: Well nourished and well developed . No acute distress. The patient appears well hydrated.  Abdomen: The abdomen is obese. The abdomen is soft and nontender. No suprapubic tenderness. No CVA tenderness.  Skin: Normal skin turgor and normal skin color and pigmentation.  Neuro/Psych:. Mood and affect are appropriate.    Results/Data Urine [Data Includes: Last 1 Day]   [id number]  COLOR YELLOW   APPEARANCE CLEAR   SPECIFIC GRAVITY 1.020   pH 7.0   GLUCOSE NEG mg/dL  BILIRUBIN NEG   KETONE NEG mg/dL  BLOOD NEG   PROTEIN NEG mg/dL  UROBILINOGEN  0.2 mg/dL  NITRITE NEG   LEUKOCYTE ESTERASE NEG    The following images/tracing/specimen were independently visualized:  CT urogram: shows large 7-8 mm chronic distal left ureteral calculus with mild left hydroureter to level of stone. This CT was reviewed by Dr. Junious Silk.  The following clinical lab reports were reviewed:  UA- negative pH: 7.0. Selected Results  AU CT-STONE  PROTOCOL [id number] 12:00AM Jimmey Ralph   Test Name Result Flag Reference  CT-STONE PROTOCOL (Report)    ** RADIOLOGY REPORT BY [facility] **   CLINICAL DATA: Left flank pain for 2 weeks. Hematuria.  EXAM: CT ABDOMEN AND PELVIS WITHOUT CONTRAST (URINARY CALCULUS PROTOCOL)  TECHNIQUE: Multidetector CT imaging was performed through the abdomen and pelvis without intravenous contrast to include the urinary tract.  COMPARISON: 05/05/2004.  FINDINGS: Lower chest: Lung bases show no acute findings. Heart size normal. No pericardial or pleural effusion.  Hepatobiliary: Liver and gallbladder are unremarkable. No biliary ductal dilatation.  Pancreas: Negative.  Spleen: Negative.  Adrenals/Urinary Tract: Adrenal glands and right kidney are unremarkable. Right ureter is decompressed. Mild left hydronephrosis secondary to a 7 mm mid left ureteral stone. Remainder of the left ureter is decompressed. Left kidney and bladder are otherwise unremarkable.  Stomach/Bowel: Stomach, small bowel, appendix and colon are unremarkable.  Vascular/Lymphatic: Vascular structures are unremarkable. No pathologically enlarged lymph nodes.  Reproductive: Prostate is normal in size.  Other: Mesenteries peritoneum are unremarkable. No free fluid.  Musculoskeletal: No worrisome lytic or sclerotic lesions.  IMPRESSION: Mild left hydronephrosis secondary to a 7 mm mid left ureteral stone.   Electronically Signed  By: Lorin Picket M.D.  On: 08/21/2014 14:42   Assessment Assessed  1. Left flank pain (R10.9) 2. Ureteral calculus, left (N20.1)  Plan  Discussed CT findings with Dr. Junious Silk. He recommends proceeding with cystourethroscopy, L RPG, stone extraction, and possible double J stent. Risk and benefits discussed which include: general anesthesia, possible injury to bladder or ureter, hematura, infection, and possible need for double J stent. Pt voices understanding and  wishes to proceed.  AU CT-STONE PROTOCOL; Status:Resulted - Requires Verification;  Done: [id number] 12:00AM [id number]; Marked Important;Ordered; Today;  VKP:QAES flank pain; Ordered LP:NPYYFR, Diane;   Electronics engineer signed by : Jimmey Ralph, Trey Paula; Aug 21 2014  2:59PM EST   ADD: I discussed patient with NP Rosalyn Gess.  I reviewed CT scan which shows a 7 mm left mid-ureteral stone.  Left kidney looks okay.

## 2014-09-02 NOTE — Anesthesia Preprocedure Evaluation (Signed)
Anesthesia Evaluation  Patient identified by MRN, date of birth, ID band Patient awake    Reviewed: Allergy & Precautions, H&P , NPO status , Patient's Chart, lab work & pertinent test results  History of Anesthesia Complications Negative for: history of anesthetic complications  Airway Mallampati: II  TM Distance: >3 FB Neck ROM: Full    Dental no notable dental hx. (+) Dental Advisory Given   Pulmonary neg pulmonary ROS,  breath sounds clear to auscultation  Pulmonary exam normal       Cardiovascular Exercise Tolerance: Good negative cardio ROS  Rhythm:Regular Rate:Normal     Neuro/Psych PSYCHIATRIC DISORDERS (ADHD) negative neurological ROS     GI/Hepatic Neg liver ROS, GERD-  ,  Endo/Other  negative endocrine ROS  Renal/GU Renal disease  negative genitourinary   Musculoskeletal negative musculoskeletal ROS (+)   Abdominal   Peds negative pediatric ROS (+)  Hematology negative hematology ROS (+)   Anesthesia Other Findings   Reproductive/Obstetrics negative OB ROS                             Anesthesia Physical Anesthesia Plan  ASA: II  Anesthesia Plan: General   Post-op Pain Management:    Induction: Intravenous  Airway Management Planned: LMA  Additional Equipment:   Intra-op Plan:   Post-operative Plan: Extubation in OR  Informed Consent: I have reviewed the patients History and Physical, chart, labs and discussed the procedure including the risks, benefits and alternatives for the proposed anesthesia with the patient or authorized representative who has indicated his/her understanding and acceptance.   Dental advisory given  Plan Discussed with: CRNA  Anesthesia Plan Comments:         Anesthesia Quick Evaluation

## 2014-09-03 ENCOUNTER — Encounter (HOSPITAL_BASED_OUTPATIENT_CLINIC_OR_DEPARTMENT_OTHER)
Admission: RE | Disposition: A | Discharge status: Home / Self Care | Payer: Self-pay | Source: Ambulatory Visit | Attending: Urology

## 2014-09-03 ENCOUNTER — Ambulatory Visit (HOSPITAL_BASED_OUTPATIENT_CLINIC_OR_DEPARTMENT_OTHER)
Admission: RE | Admit: 2014-09-03 | Discharge: 2014-09-03 | Disposition: A | Discharge status: Home / Self Care | Payer: BC Managed Care – PPO | Source: Ambulatory Visit | Attending: Urology | Admitting: Urology

## 2014-09-03 ENCOUNTER — Encounter (HOSPITAL_BASED_OUTPATIENT_CLINIC_OR_DEPARTMENT_OTHER): Payer: Self-pay | Admitting: *Deleted

## 2014-09-03 ENCOUNTER — Ambulatory Visit (HOSPITAL_BASED_OUTPATIENT_CLINIC_OR_DEPARTMENT_OTHER): Payer: BC Managed Care – PPO | Admitting: Anesthesiology

## 2014-09-03 DIAGNOSIS — N201 Calculus of ureter: Secondary | ICD-10-CM | POA: Diagnosis not present

## 2014-09-03 DIAGNOSIS — K219 Gastro-esophageal reflux disease without esophagitis: Secondary | ICD-10-CM | POA: Insufficient documentation

## 2014-09-03 HISTORY — DX: Presence of spectacles and contact lenses: Z97.3

## 2014-09-03 HISTORY — DX: Gastro-esophageal reflux disease without esophagitis: K21.9

## 2014-09-03 HISTORY — DX: Calculus of ureter: N20.1

## 2014-09-03 HISTORY — PX: CYSTOSCOPY WITH RETROGRADE PYELOGRAM, URETEROSCOPY AND STENT PLACEMENT: SHX5789

## 2014-09-03 HISTORY — DX: Personal history of other specified conditions: Z87.898

## 2014-09-03 LAB — POCT HEMOGLOBIN-HEMACUE: Hemoglobin: 14.7 g/dL (ref 13.0–17.0)

## 2014-09-03 SURGERY — CYSTOURETEROSCOPY, WITH RETROGRADE PYELOGRAM AND STENT INSERTION
Anesthesia: General | Site: Ureter | Laterality: Left

## 2014-09-03 MED ORDER — IOHEXOL 350 MG/ML SOLN
INTRAVENOUS | Status: DC | PRN
  Administered 2014-09-03: 5 mL

## 2014-09-03 MED ORDER — LACTATED RINGERS IV SOLN
INTRAVENOUS | Status: DC | PRN
  Administered 2014-09-03 (×2): via INTRAVENOUS

## 2014-09-03 MED ORDER — KETOROLAC TROMETHAMINE 30 MG/ML IJ SOLN
INTRAMUSCULAR | Status: DC | PRN
  Administered 2014-09-03: 30 mg via INTRAVENOUS

## 2014-09-03 MED ORDER — MIDAZOLAM HCL 2 MG/2ML IJ SOLN
INTRAMUSCULAR | Status: AC
  Filled 2014-09-03: qty 2

## 2014-09-03 MED ORDER — METOCLOPRAMIDE HCL 5 MG/ML IJ SOLN
INTRAMUSCULAR | Status: DC | PRN
  Administered 2014-09-03: 10 mg via INTRAVENOUS

## 2014-09-03 MED ORDER — OXYCODONE-ACETAMINOPHEN 5-325 MG PO TABS: 1.0000 | ORAL_TABLET | Freq: Four times a day (QID) | ORAL | Status: DC | PRN

## 2014-09-03 MED ORDER — CEFAZOLIN SODIUM 1-5 GM-% IV SOLN
1.0000 g | INTRAVENOUS | Status: DC
  Filled 2014-09-03: qty 50

## 2014-09-03 MED ORDER — CEFAZOLIN SODIUM-DEXTROSE 2-3 GM-% IV SOLR
INTRAVENOUS | Status: AC
  Filled 2014-09-03: qty 50

## 2014-09-03 MED ORDER — LACTATED RINGERS IV SOLN
INTRAVENOUS | Status: DC
  Administered 2014-09-03: 10:00:00 via INTRAVENOUS
  Filled 2014-09-03: qty 1000

## 2014-09-03 MED ORDER — BELLADONNA ALKALOIDS-OPIUM 16.2-60 MG RE SUPP
RECTAL | Status: DC | PRN
  Administered 2014-09-03: 1 via RECTAL

## 2014-09-03 MED ORDER — LIDOCAINE HCL (CARDIAC) 20 MG/ML IV SOLN
INTRAVENOUS | Status: DC | PRN
  Administered 2014-09-03: 100 mg via INTRAVENOUS

## 2014-09-03 MED ORDER — ACETAMINOPHEN 10 MG/ML IV SOLN
INTRAVENOUS | Status: DC | PRN
  Administered 2014-09-03: 1000 mg via INTRAVENOUS

## 2014-09-03 MED ORDER — OXYCODONE-ACETAMINOPHEN 5-325 MG PO TABS
ORAL_TABLET | ORAL | Status: AC
  Filled 2014-09-03: qty 1

## 2014-09-03 MED ORDER — CEFAZOLIN SODIUM 1 G IJ SOLR
INTRAMUSCULAR | Status: AC
  Filled 2014-09-03: qty 10

## 2014-09-03 MED ORDER — FENTANYL CITRATE 0.05 MG/ML IJ SOLN
INTRAMUSCULAR | Status: DC | PRN
  Administered 2014-09-03 (×5): 25 ug via INTRAVENOUS
  Administered 2014-09-03: 50 ug via INTRAVENOUS
  Administered 2014-09-03: 25 ug via INTRAVENOUS

## 2014-09-03 MED ORDER — OXYCODONE-ACETAMINOPHEN 5-325 MG PO TABS
1.0000 | ORAL_TABLET | ORAL | Status: DC | PRN
  Administered 2014-09-03: 1 via ORAL
  Filled 2014-09-03: qty 2

## 2014-09-03 MED ORDER — FENTANYL CITRATE 0.05 MG/ML IJ SOLN
25.0000 ug | INTRAMUSCULAR | Status: DC | PRN
  Filled 2014-09-03: qty 1

## 2014-09-03 MED ORDER — DEXTROSE 5 % IV SOLN
3.0000 g | INTRAVENOUS | Status: AC
  Administered 2014-09-03: 3 g via INTRAVENOUS
  Filled 2014-09-03: qty 3000

## 2014-09-03 MED ORDER — MIDAZOLAM HCL 5 MG/5ML IJ SOLN
INTRAMUSCULAR | Status: DC | PRN
  Administered 2014-09-03 (×2): 1 mg via INTRAVENOUS

## 2014-09-03 MED ORDER — FENTANYL CITRATE 0.05 MG/ML IJ SOLN
INTRAMUSCULAR | Status: AC
  Filled 2014-09-03: qty 6

## 2014-09-03 MED ORDER — SODIUM CHLORIDE 0.9 % IR SOLN
Status: DC | PRN
  Administered 2014-09-03: 6000 mL

## 2014-09-03 MED ORDER — DEXAMETHASONE SODIUM PHOSPHATE 10 MG/ML IJ SOLN
INTRAMUSCULAR | Status: DC | PRN
  Administered 2014-09-03: 10 mg via INTRAVENOUS

## 2014-09-03 MED ORDER — PROPOFOL 10 MG/ML IV BOLUS
INTRAVENOUS | Status: DC | PRN
  Administered 2014-09-03: 300 mg via INTRAVENOUS

## 2014-09-03 MED ORDER — BELLADONNA ALKALOIDS-OPIUM 16.2-60 MG RE SUPP
RECTAL | Status: AC
  Filled 2014-09-03: qty 1

## 2014-09-03 MED ORDER — ONDANSETRON HCL 4 MG/2ML IJ SOLN
INTRAMUSCULAR | Status: DC | PRN
  Administered 2014-09-03: 4 mg via INTRAVENOUS

## 2014-09-03 MED ORDER — ONDANSETRON HCL 4 MG/2ML IJ SOLN
4.0000 mg | Freq: Once | INTRAMUSCULAR | Status: DC | PRN
  Filled 2014-09-03: qty 2

## 2014-09-03 MED ORDER — LIDOCAINE HCL 2 % EX GEL
CUTANEOUS | Status: DC | PRN
  Administered 2014-09-03: 1 via URETHRAL

## 2014-09-03 SURGICAL SUPPLY — 44 items
ADAPTER CATH URET PLST 4-6FR (CATHETERS) IMPLANT
ADPR CATH URET STRL DISP 4-6FR (CATHETERS)
BAG DRAIN URO-CYSTO SKYTR STRL (DRAIN) ×3 IMPLANT
BAG DRN UROCATH (DRAIN) ×2
BASKET LASER NITINOL 1.9FR (BASKET) IMPLANT
BASKET STNLS GEMINI 4WIRE 3FR (BASKET) IMPLANT
BASKET ZERO TIP NITINOL 2.4FR (BASKET) IMPLANT
BSKT STON RTRVL 120 1.9FR (BASKET)
BSKT STON RTRVL GEM 120X11 3FR (BASKET)
BSKT STON RTRVL ZERO TP 2.4FR (BASKET)
CANISTER SUCT LVC 12 LTR MEDI- (MISCELLANEOUS) ×2 IMPLANT
CATH INTERMIT  6FR 70CM (CATHETERS) IMPLANT
CATH URET 5FR 28IN CONE TIP (BALLOONS)
CATH URET 5FR 28IN OPEN ENDED (CATHETERS) IMPLANT
CATH URET 5FR 70CM CONE TIP (BALLOONS) IMPLANT
CATH URET DUAL LUMEN 6-10FR 50 (CATHETERS) ×2 IMPLANT
CLOTH BEACON ORANGE TIMEOUT ST (SAFETY) ×3 IMPLANT
DRAPE CAMERA CLOSED 9X96 (DRAPES) ×2 IMPLANT
ELECT REM PT RETURN 9FT ADLT (ELECTROSURGICAL)
ELECTRODE REM PT RTRN 9FT ADLT (ELECTROSURGICAL) IMPLANT
FIBER LASER FLEXIVA 200 (UROLOGICAL SUPPLIES) IMPLANT
FIBER LASER FLEXIVA 365 (UROLOGICAL SUPPLIES) IMPLANT
GLOVE BIO SURGEON STRL SZ7.5 (GLOVE) ×3 IMPLANT
GLOVE BIOGEL PI IND STRL 7.5 (GLOVE) ×2 IMPLANT
GLOVE BIOGEL PI INDICATOR 7.5 (GLOVE) ×2
GLOVE INDICATOR 7.5 STRL GRN (GLOVE) ×2 IMPLANT
GOWN PREVENTION PLUS LG XLONG (DISPOSABLE) ×3 IMPLANT
GOWN STRL REIN XL XLG (GOWN DISPOSABLE) ×3 IMPLANT
GOWN STRL REUS W/TWL XL LVL3 (GOWN DISPOSABLE) ×2 IMPLANT
GUIDEWIRE 0.038 PTFE COATED (WIRE) ×1 IMPLANT
GUIDEWIRE ANG ZIPWIRE 038X150 (WIRE) IMPLANT
GUIDEWIRE STR DUAL SENSOR (WIRE) ×3 IMPLANT
IV NS IRRIG 3000ML ARTHROMATIC (IV SOLUTION) ×6 IMPLANT
KIT BALLIN UROMAX 15FX10 (LABEL) IMPLANT
KIT BALLN UROMAX 15FX4 (MISCELLANEOUS) IMPLANT
KIT BALLN UROMAX 26 75X4 (MISCELLANEOUS)
PACK CYSTO (CUSTOM PROCEDURE TRAY) ×3 IMPLANT
SET HIGH PRES BAL DIL (LABEL)
SHEATH ACCESS URETERAL 24CM (SHEATH) ×2 IMPLANT
SHEATH ACCESS URETERAL 38CM (SHEATH) IMPLANT
SHEATH URET ACCESS 12FR/35CM (UROLOGICAL SUPPLIES) IMPLANT
SHEATH URET ACCESS 12FR/55CM (UROLOGICAL SUPPLIES) IMPLANT
STENT URET 6FRX28 CONTOUR (STENTS) ×2 IMPLANT
SYRINGE IRR TOOMEY STRL 70CC (SYRINGE) IMPLANT

## 2014-09-03 NOTE — Anesthesia Postprocedure Evaluation (Signed)
  Anesthesia Post-op Note  Patient: Chris Murphy  Procedure(s) Performed: Procedure(s) (LRB): CYSTOSCOPY WITH LEFT RETROGRADE PYELOGRAM, LEFT URETEROSCOPY AND  DOUBLE J STENT PLACEMENT (Left)  Patient Location: PACU  Anesthesia Type: General  Level of Consciousness: awake and alert   Airway and Oxygen Therapy: Patient Spontanous Breathing  Post-op Pain: mild  Post-op Assessment: Post-op Vital signs reviewed, Patient's Cardiovascular Status Stable, Respiratory Function Stable, Patent Airway and No signs of Nausea or vomiting  Last Vitals:  Filed Vitals:   09/03/14 1125  BP: 135/82  Pulse: 65  Temp: 36.2 C  Resp: 16    Post-op Vital Signs: stable   Complications: No apparent anesthesia complications

## 2014-09-03 NOTE — Discharge Instructions (Signed)
Ureteral Stent Implantation, Care After Refer to this sheet in the next few weeks. These instructions provide you with information on caring for yourself after your procedure. Your health care provider may also give you more specific instructions. Your treatment has been planned according to current medical practices, but problems sometimes occur. Call your health care provider if you have any problems or questions after your procedure. WHAT TO EXPECT AFTER THE PROCEDURE You should be back to normal activity within 48 hours after the procedure. Nausea and vomiting may occur and are commonly the result of anesthesia. It is common to experience sharp pain in the back or lower abdomen and penis with voiding. This is caused by movement of the ends of the stent with the act of urinating.It usually goes away within minutes after you have stopped urinating. HOME CARE INSTRUCTIONS Make sure to drink plenty of fluids. You may have small amounts of bleeding, causing your urine to be red. This is normal. Certain movements may trigger pain or a feeling that you need to urinate. You may be given medicines to prevent infection or bladder spasms. Be sure to take all medicines as directed. Only take over-the-counter or prescription medicines for pain, discomfort, or fever as directed by your health care provider. Do not take aspirin, as this can make bleeding worse.  Your stent will be left in until the blockage is resolved. Office will call to schedule next look into the left ureter (ureteroscopy).     SEEK MEDICAL CARE IF:  You experience increasing pain.  Your pain medicine is not working. SEEK IMMEDIATE MEDICAL CARE IF:  Your urine is dark red or has blood clots.  You are leaking urine (incontinent).  You have a fever, chills, feeling sick to your stomach (nausea), or vomiting.  Your pain is not relieved by pain medicine.  The end of the stent comes out of the urethra.  You are unable to  urinate. Document Released: 05/30/2013 Document Revised: 10/02/2013 Document Reviewed: 05/30/2013 [facility] Patient Information 2015 [place], [place]. This information is not intended to replace advice given to you by your health care provider. Make sure you discuss any questions you have with your health care provider.   Post Anesthesia Home Care Instructions  Activity: Get plenty of rest for the remainder of the day. A responsible adult should stay with you for 24 hours following the procedure.  For the next 24 hours, DO NOT: -Drive a car -Paediatric nurse -Drink alcoholic beverages -Take any medication unless instructed by your physician -Make any legal decisions or sign important papers.  Meals: Start with liquid foods such as gelatin or soup. Progress to regular foods as tolerated. Avoid greasy, spicy, heavy foods. If nausea and/or vomiting occur, drink only clear liquids until the nausea and/or vomiting subsides. Call your physician if vomiting continues.  Special Instructions/Symptoms: Your throat may feel dry or sore from the anesthesia or the breathing tube placed in your throat during surgery. If this causes discomfort, gargle with warm salt water. The discomfort should disappear within 24 hours.

## 2014-09-03 NOTE — Anesthesia Procedure Notes (Addendum)
Procedure Name: LMA Insertion Date/Time: 09/03/2014 10:37 AM Performed by: Justice Rocher Pre-anesthesia Checklist: Patient identified, Emergency Drugs available, Suction available and Patient being monitored Patient Re-evaluated:Patient Re-evaluated prior to inductionOxygen Delivery Method: Circle System Utilized Preoxygenation: Pre-oxygenation with 100% oxygen Intubation Type: IV induction Ventilation: Mask ventilation without difficulty LMA: LMA inserted LMA Size: 5.0 Number of attempts: 1 Airway Equipment and Method: bite block Placement Confirmation: positive ETCO2 Tube secured with: Tape Dental Injury: Teeth and Oropharynx as per pre-operative assessment  Comments: Utilizing wedge grey support under shoulders, blankets x 3 under head and shoulders - adequate sniffing position, PreO2 well 100 % - induction LB

## 2014-09-03 NOTE — Transfer of Care (Signed)
Immediate Anesthesia Transfer of Care Note  Patient: Chris Murphy  Procedure(s) Performed: Procedure(s) (LRB): CYSTOSCOPY WITH LEFT RETROGRADE PYELOGRAM, LEFT URETEROSCOPY AND  DOUBLE J STENT PLACEMENT (Left)  Patient Location: PACU  Anesthesia Type: General  Level of Consciousness: awake, sedated, patient cooperative and responds to stimulation  Airway & Oxygen Therapy: Patient Spontanous Breathing and Patient connected to face mask oxygen  Post-op Assessment: Report given to PACU RN, Post -op Vital signs reviewed and stable and Patient moving all extremities  Post vital signs: Reviewed and stable  Complications: No apparent anesthesia complications

## 2014-09-03 NOTE — Interval H&P Note (Signed)
History and Physical Interval Note:  09/03/2014 10:23 AM  Chris Murphy  has presented today for surgery, with the diagnosis of LEFT DISTAL URETERAL STONE   The various methods of treatment have been discussed with the patient and family. After consideration of risks, benefits and other options for treatment, the patient has consented to  Procedure(s): CYSTOSCOPY WITH LEFT RETROGRADE PYELOGRAM, LEFT URETEROSCOPY AND STONE EXTRACTION POSSIBLE DOUBLE J STENT PLACEMENT (Left) HOLMIUM LASER APPLICATION (Left) as a surgical intervention .  The patient's history has been reviewed, patient examined, no change in status, stable for surgery.  I have reviewed the patient's chart and labs.  I discussed patient with NP Rosalyn Gess. I discussed with patient and Mom the nature, potential benefits, risks and alternatives to cysto, left RGP, left URS, HLL, stent, including side effects of the proposed treatment, the likelihood of the patient achieving the goals of the procedure, and any potential problems that might occur during the procedure or recuperation. Given the prolonged time the stone has been there and location near iliac I discussed higher likelihood of needing a staged procedure. All questions answered. Patient elects to proceed.     Festus Aloe

## 2014-09-03 NOTE — Op Note (Signed)
Preoperative diagnosis: Left ureteral stone Postoperative diagnosis: Left ureteral stone  Procedure: Cystoscopy with left retrograde pyelogram left ureteroscopy and left ureteral stent placement  Surgeon: Junious Silk  Anesthesia: Judd  Type of anesthesia: Gen.  Indication for procedure patient is a 23 year old male with the left mid ureteral stone just above the iliacs. He was partially fragmented with shockwave lithotripsy but a large piece remains. He's had some on and off flank pain. He presents for cystoscopy with left  ureteroscopy attempted holmium laser lithotripsy.  Findings:  On retrograde pyelogram there is a filling defect in the left mid ureter close to the proximal ureter consistent with the known stone. There was mild hydroureteronephrosis proximal to the stone with no other filling defects or stricture. Distal to the stone the ureter appeared normal.  On left ureteroscopy the distal ureter was quite tight and the mucosa began to shear even with the smallest newest digital ureteroscope. I was still a few centimeters away from the stone. Therefore a stent was placed.  Description of procedure:  patient was brought to the operating room placed supine. After adequate anesthesia his placed in lithotomy position and prepped and draped in the usual sterile fashion. A cystoscope was passed per urethra and the left ureteral orifice cannulated with  6 [place] open-ended catheter. Retrograde injection of contrast was performed. A sensor wire was advanced and coiled in the collecting system. I passed the semirigid ureteroscope but it would not get through the intramural ureter. Therefore was removed. I was able to pass a dual-lumen exchange catheter on the about halfway up the distal ureter. The rigid ureteroscope was passed again but I could not get to the mid ureter. The ureter was tightening and bunching around the scope. Therefore a second wire was passed. Over the second wire a passed ureteral  access sheath into the distal ureter. I then advanced the smallest newest digital ureteroscope which was able to access the mid ureter but the mucosa in the mid ureter began to shear as it was quite tight also. I was still several centimeters away from the iliac vessel in the stone. It was felt it was best to leave a ureteral stent. The digital ureteroscope was backed out along with the access sheath.  Over the remaining wire I passed the 6 [place] open-ended catheter and it sounded past the stone in the mid ureter which was quite tight. I passed the 6 [place] open-ended catheter into the region of the left renal pelvis and removed the wire. There was a brisk hydronephrotic drip and reintroduction of contrast again outlined the collecting stem renal pelvis and proximal ureter indicating proper placement of the wire in the collecting system. The 6 [place] open-ended catheter was backed out and the wire backloaded on the cystoscope. A 628 7 ureter stent was advanced. The wire was removed with a good coil reconstituting in the left upper pole collecting system and a good coil in the bladder. The bladder was drained and the scope removed. The urethra was filled with some lidocaine jelly and a penile clamp placed. The patient was awakened taken to recovery room in stable condition.  Complications: None Specimens: None Blood loss: Minimal  Drains: 6 x 28 cm left ureteral stent  Disposition: Patient stable to I'll plan to bring him back in 2-3 weeks for left ureteroscopy.

## 2014-09-04 ENCOUNTER — Encounter (HOSPITAL_BASED_OUTPATIENT_CLINIC_OR_DEPARTMENT_OTHER): Payer: Self-pay | Admitting: Urology

## 2014-09-09 ENCOUNTER — Encounter (HOSPITAL_BASED_OUTPATIENT_CLINIC_OR_DEPARTMENT_OTHER): Payer: Self-pay | Admitting: *Deleted

## 2014-09-09 ENCOUNTER — Other Ambulatory Visit: Payer: Self-pay | Admitting: Urology

## 2014-09-09 NOTE — Progress Notes (Signed)
Pt instructed npo p mn 12/3.  Ok to take pain/ nausea med if needed. To [facility] 12/4 @ 1130. hgb w chart.

## 2014-09-13 ENCOUNTER — Ambulatory Visit (HOSPITAL_BASED_OUTPATIENT_CLINIC_OR_DEPARTMENT_OTHER)
Admission: RE | Admit: 2014-09-13 | Discharge: 2014-09-13 | Disposition: A | Discharge status: Home / Self Care | Payer: BC Managed Care – PPO | Source: Ambulatory Visit | Attending: Urology | Admitting: Urology

## 2014-09-13 ENCOUNTER — Ambulatory Visit (HOSPITAL_BASED_OUTPATIENT_CLINIC_OR_DEPARTMENT_OTHER): Payer: BC Managed Care – PPO | Admitting: Anesthesiology

## 2014-09-13 ENCOUNTER — Encounter (HOSPITAL_BASED_OUTPATIENT_CLINIC_OR_DEPARTMENT_OTHER)
Admission: RE | Disposition: A | Discharge status: Home / Self Care | Payer: Self-pay | Source: Ambulatory Visit | Attending: Urology

## 2014-09-13 ENCOUNTER — Encounter (HOSPITAL_BASED_OUTPATIENT_CLINIC_OR_DEPARTMENT_OTHER): Payer: Self-pay | Admitting: *Deleted

## 2014-09-13 DIAGNOSIS — K219 Gastro-esophageal reflux disease without esophagitis: Secondary | ICD-10-CM | POA: Diagnosis not present

## 2014-09-13 DIAGNOSIS — R55 Syncope and collapse: Secondary | ICD-10-CM | POA: Diagnosis not present

## 2014-09-13 DIAGNOSIS — F909 Attention-deficit hyperactivity disorder, unspecified type: Secondary | ICD-10-CM | POA: Insufficient documentation

## 2014-09-13 DIAGNOSIS — N201 Calculus of ureter: Secondary | ICD-10-CM | POA: Insufficient documentation

## 2014-09-13 DIAGNOSIS — R310 Gross hematuria: Secondary | ICD-10-CM | POA: Insufficient documentation

## 2014-09-13 DIAGNOSIS — Z8669 Personal history of other diseases of the nervous system and sense organs: Secondary | ICD-10-CM | POA: Insufficient documentation

## 2014-09-13 HISTORY — PX: HOLMIUM LASER APPLICATION: SHX5852

## 2014-09-13 HISTORY — PX: CYSTOSCOPY WITH URETEROSCOPY AND STENT PLACEMENT: SHX6377

## 2014-09-13 LAB — POCT HEMOGLOBIN-HEMACUE: Hemoglobin: 15 g/dL (ref 13.0–17.0)

## 2014-09-13 SURGERY — CYSTOURETEROSCOPY, WITH STENT INSERTION
Anesthesia: General | Site: Ureter | Laterality: Left

## 2014-09-13 MED ORDER — FENTANYL CITRATE 0.05 MG/ML IJ SOLN
INTRAMUSCULAR | Status: DC | PRN
  Administered 2014-09-13 (×3): 50 ug via INTRAVENOUS

## 2014-09-13 MED ORDER — CEFAZOLIN SODIUM-DEXTROSE 2-3 GM-% IV SOLR
INTRAVENOUS | Status: AC
  Filled 2014-09-13: qty 50

## 2014-09-13 MED ORDER — CEFAZOLIN SODIUM-DEXTROSE 2-3 GM-% IV SOLR
2.0000 g | INTRAVENOUS | Status: AC
  Administered 2014-09-13: 2 g via INTRAVENOUS
  Filled 2014-09-13: qty 50

## 2014-09-13 MED ORDER — CEFAZOLIN SODIUM 1-5 GM-% IV SOLN
1.0000 g | INTRAVENOUS | Status: DC
  Filled 2014-09-13: qty 50

## 2014-09-13 MED ORDER — MIDAZOLAM HCL 5 MG/5ML IJ SOLN
INTRAMUSCULAR | Status: DC | PRN
  Administered 2014-09-13: 2 mg via INTRAVENOUS

## 2014-09-13 MED ORDER — LACTATED RINGERS IV SOLN
INTRAVENOUS | Status: DC
  Filled 2014-09-13: qty 1000

## 2014-09-13 MED ORDER — FENTANYL CITRATE 0.05 MG/ML IJ SOLN
INTRAMUSCULAR | Status: AC
  Filled 2014-09-13: qty 4

## 2014-09-13 MED ORDER — DEXAMETHASONE SODIUM PHOSPHATE 4 MG/ML IJ SOLN
INTRAMUSCULAR | Status: DC | PRN
  Administered 2014-09-13: 10 mg via INTRAVENOUS

## 2014-09-13 MED ORDER — LIDOCAINE HCL (CARDIAC) 20 MG/ML IV SOLN
INTRAVENOUS | Status: DC | PRN
  Administered 2014-09-13: 100 mg via INTRAVENOUS

## 2014-09-13 MED ORDER — MIDAZOLAM HCL 2 MG/2ML IJ SOLN
INTRAMUSCULAR | Status: AC
  Filled 2014-09-13: qty 2

## 2014-09-13 MED ORDER — LACTATED RINGERS IV SOLN
INTRAVENOUS | Status: DC
  Administered 2014-09-13 (×2): via INTRAVENOUS
  Filled 2014-09-13: qty 1000

## 2014-09-13 MED ORDER — ACETAMINOPHEN 10 MG/ML IV SOLN
INTRAVENOUS | Status: DC | PRN
  Administered 2014-09-13: 1000 mg via INTRAVENOUS

## 2014-09-13 MED ORDER — KETOROLAC TROMETHAMINE 30 MG/ML IJ SOLN
INTRAMUSCULAR | Status: DC | PRN
  Administered 2014-09-13: 30 mg via INTRAVENOUS

## 2014-09-13 MED ORDER — METOCLOPRAMIDE HCL 5 MG/ML IJ SOLN
INTRAMUSCULAR | Status: DC | PRN
  Administered 2014-09-13: 10 mg via INTRAVENOUS

## 2014-09-13 MED ORDER — FENTANYL CITRATE 0.05 MG/ML IJ SOLN
25.0000 ug | INTRAMUSCULAR | Status: DC | PRN
  Filled 2014-09-13: qty 1

## 2014-09-13 MED ORDER — PROPOFOL 10 MG/ML IV BOLUS
INTRAVENOUS | Status: DC | PRN
  Administered 2014-09-13: 250 mg via INTRAVENOUS
  Administered 2014-09-13: 50 mg via INTRAVENOUS

## 2014-09-13 MED ORDER — SODIUM CHLORIDE 0.9 % IR SOLN
Status: DC | PRN
  Administered 2014-09-13: 3000 mL

## 2014-09-13 MED ORDER — EPHEDRINE SULFATE 50 MG/ML IJ SOLN
INTRAMUSCULAR | Status: DC | PRN
  Administered 2014-09-13: 10 mg via INTRAVENOUS

## 2014-09-13 MED ORDER — ONDANSETRON HCL 4 MG/2ML IJ SOLN
INTRAMUSCULAR | Status: DC | PRN
  Administered 2014-09-13: 4 mg via INTRAVENOUS

## 2014-09-13 SURGICAL SUPPLY — 25 items
BAG DRAIN URO-CYSTO SKYTR STRL (DRAIN) ×3 IMPLANT
BAG DRN UROCATH (DRAIN) ×1
BASKET LASER NITINOL 1.9FR (BASKET) IMPLANT
BASKET STNLS GEMINI 4WIRE 3FR (BASKET) IMPLANT
BASKET ZERO TIP NITINOL 2.4FR (BASKET) ×2 IMPLANT
BSKT STON RTRVL 120 1.9FR (BASKET)
BSKT STON RTRVL GEM 120X11 3FR (BASKET)
BSKT STON RTRVL ZERO TP 2.4FR (BASKET) ×1
CANISTER SUCT LVC 12 LTR MEDI- (MISCELLANEOUS) ×2 IMPLANT
CATH INTERMIT  6FR 70CM (CATHETERS) ×2 IMPLANT
CATH URET 5FR 28IN OPEN ENDED (CATHETERS) IMPLANT
CLOTH BEACON ORANGE TIMEOUT ST (SAFETY) ×3 IMPLANT
DRAPE CAMERA CLOSED 9X96 (DRAPES) ×2 IMPLANT
FIBER LASER TRAC TIP (UROLOGICAL SUPPLIES) ×2 IMPLANT
GLOVE BIO SURGEON STRL SZ7.5 (GLOVE) ×3 IMPLANT
GLOVE BIOGEL PI IND STRL 6.5 (GLOVE) IMPLANT
GLOVE BIOGEL PI INDICATOR 6.5 (GLOVE) ×4
GOWN STRL REUS W/TWL LRG LVL3 (GOWN DISPOSABLE) ×2 IMPLANT
GOWN STRL REUS W/TWL XL LVL3 (GOWN DISPOSABLE) ×2 IMPLANT
GUIDEWIRE ANG ZIPWIRE 038X150 (WIRE) ×2 IMPLANT
GUIDEWIRE STR DUAL SENSOR (WIRE) ×3 IMPLANT
IV NS IRRIG 3000ML ARTHROMATIC (IV SOLUTION) ×4 IMPLANT
PACK CYSTO (CUSTOM PROCEDURE TRAY) ×3 IMPLANT
SHEATH ACCESS URETERAL 38CM (SHEATH) ×2 IMPLANT
STENT URET 6FRX28 CONTOUR (STENTS) ×2 IMPLANT

## 2014-09-13 NOTE — H&P (Signed)
H&P  Chief Complaint: Left ureteral stone   History of Present Illness: Pt presents for staged left URS, HLL, stent after attempted URS and pre-stenting a couple of weeks ago. He has been well. No dysuria or fever. Gross hematuria occurred after some lifting but has cleared. He has no complaints.   Past Medical History  Diagnosis Date  . ADHD (attention deficit hyperactivity disorder)   . Left ureteral calculus   . History of syncope     vasovagal episode  . GERD (gastroesophageal reflux disease)     watches diet  . History of idiopathic seizure     hx 2006 seizures w/ vomiting; per pt had work-up unknown etiology/  none until may 2015 x1 seizure w/ vomiting    . Wears glasses    Past Surgical History  Procedure Laterality Date  . Extracorporeal shock wave lithotripsy Left left 07-04-2014  &  2005  . Knee arthroscopy / menisectomy/ synovectomy/ debridement partial acl tear Right 06-28-2001  . Cystoscopy with retrograde pyelogram, ureteroscopy and stent placement Left 09/03/2014    Procedure: CYSTOSCOPY WITH LEFT RETROGRADE PYELOGRAM, LEFT URETEROSCOPY AND  DOUBLE J STENT PLACEMENT;  Surgeon: Festus Aloe, MD;  Location: [facility];  Service: Urology;  Laterality: Left;    Home Medications:  Prescriptions prior to admission  Medication Sig Dispense Refill Last Dose  . methylphenidate 54 MG PO CR tablet Take 1 tablet (54 mg total) by mouth every morning. (Patient taking differently: Take 54 mg by mouth every morning. CONCERTA) 30 tablet 0 98/06/2118 at Unknown time  . ondansetron (ZOFRAN) 4 MG tablet Take 4 mg by mouth every 8 (eight) hours as needed.    Past Week at Unknown time  . oxyCODONE-acetaminophen (ROXICET) 5-325 MG per tablet Take 1-2 tablets by mouth every 4 (four) hours as needed for severe pain. 30 tablet 0 Past Week at Unknown time  . oxyCODONE-acetaminophen (ROXICET) 5-325 MG per tablet Take 1-2 tablets by mouth every 6 (six) hours as needed for  severe pain. 30 tablet 0 Past Week at Unknown time   Allergies: No Known Allergies  History reviewed. No pertinent family history. Social History:  reports that he has never smoked. He has never used smokeless tobacco. He reports that he does not drink alcohol or use illicit drugs.  ROS: A complete review of systems was performed.  All systems are negative except for pertinent findings as noted. ROS   Physical Exam:  Vital signs in last 24 hours: Temp:  [98.2 F (36.8 C)] 98.2 F (36.8 C) (12/04 1111) Pulse Rate:  [66] 66 (12/04 1111) Resp:  [18] 18 (12/04 1111) BP: (123)/(79) 123/79 mmHg (12/04 1111) SpO2:  [98 %] 98 % (12/04 1111) Weight:  [119.976 kg (264 lb 8 oz)-120.203 kg (265 lb)] 119.976 kg (264 lb 8 oz) (12/04 1111) General:  Alert and oriented, No acute distress HEENT: Normocephalic, atraumatic Neck: No JVD or lymphadenopathy Cardiovascular: Regular rate and rhythm Lungs: Regular rate and effort Abdomen: Soft, nontender, nondistended, no abdominal masses Back: No CVA tenderness Extremities: No edema Neurologic: Grossly intact  Laboratory Data:  Results for orders placed or performed during the hospital encounter of 09/13/14 (from the past 24 hour(s))  Hemoglobin-hemacue, POC     Status: None   Collection Time: 09/13/14 11:42 AM  Result Value Ref Range   Hemoglobin 15.0 13.0 - 17.0 g/dL   No results found for this or any previous visit (from the past 240 hour(s)). Creatinine: No results for input(s): CREATININE  in the last 168 hours.  Impression/Assessment/plan: I discussed with the patient and his mom the nature, potential benefits, risks and alternatives to cysto, left URS, HLL, stent, including side effects of the proposed treatment, the likelihood of the patient achieving the goals of the procedure, and any potential problems that might occur during the procedure or recuperation. All questions answered. Patient elects to proceed. Discussed risk of ureteral  injury among others and that he may need another staged URS if any ureteral trauma is evident. Discussed likelihood of success > 90% but a chance stone cannot be accessed or visualized retrograde which would require antegrade approach.    Festus Aloe 09/13/2014, 1:42 PM

## 2014-09-13 NOTE — Anesthesia Postprocedure Evaluation (Signed)
  Anesthesia Post-op Note  Patient: Chris Murphy  Procedure(s) Performed: Procedure(s) (LRB): CYSTOSCOPY WITH LEFT URETEROSCOPY AND STENT removal and replacement, Basket stone extraction  (Left) HOLMIUM LASER  (Left)  Patient Location: PACU  Anesthesia Type: General  Level of Consciousness: awake and alert   Airway and Oxygen Therapy: Patient Spontanous Breathing  Post-op Pain: mild  Post-op Assessment: Post-op Vital signs reviewed, Patient's Cardiovascular Status Stable, Respiratory Function Stable, Patent Airway and No signs of Nausea or vomiting  Last Vitals:  Filed Vitals:   09/13/14 1700  BP: 108/59  Pulse: 81  Temp:   Resp: 19    Post-op Vital Signs: stable   Complications: No apparent anesthesia complications

## 2014-09-13 NOTE — Anesthesia Preprocedure Evaluation (Addendum)
Anesthesia Evaluation  Patient identified by MRN, date of birth, ID band Patient awake    Reviewed: Allergy & Precautions, H&P , NPO status , Patient's Chart, lab work & pertinent test results  History of Anesthesia Complications Negative for: history of anesthetic complications  Airway Mallampati: II  TM Distance: >3 FB Neck ROM: Full    Dental no notable dental hx. (+) Dental Advisory Given, Teeth Intact   Pulmonary neg pulmonary ROS,  breath sounds clear to auscultation  Pulmonary exam normal       Cardiovascular Exercise Tolerance: Good negative cardio ROS  Rhythm:Regular Rate:Normal  Vasovagal syncope   Neuro/Psych Seizures -,  PSYCHIATRIC DISORDERS (ADHD) 2006 and 2015 seizures not explained negative neurological ROS     GI/Hepatic Neg liver ROS, GERD-  ,  Endo/Other  negative endocrine ROS  Renal/GU Renal disease  negative genitourinary   Musculoskeletal negative musculoskeletal ROS (+)   Abdominal   Peds negative pediatric ROS (+)  Hematology negative hematology ROS (+)   Anesthesia Other Findings Poor dental hygiene  Reproductive/Obstetrics negative OB ROS                          Anesthesia Physical Anesthesia Plan  ASA: II  Anesthesia Plan: General   Post-op Pain Management:    Induction: Intravenous  Airway Management Planned: LMA  Additional Equipment:   Intra-op Plan:   Post-operative Plan:   Informed Consent:   Plan Discussed with: Surgeon  Anesthesia Plan Comments:         Anesthesia Quick Evaluation

## 2014-09-13 NOTE — Discharge Instructions (Signed)
Ureteral Stent Implantation, Care After Refer to this sheet in the next few weeks. These instructions provide you with information on caring for yourself after your procedure. Your health care provider may also give you more specific instructions. Your treatment has been planned according to current medical practices, but problems sometimes occur. Call your health care provider if you have any problems or questions after your procedure. WHAT TO EXPECT AFTER THE PROCEDURE You should be back to normal activity within 48 hours after the procedure. Nausea and vomiting may occur and are commonly the result of anesthesia. It is common to experience sharp pain in the back or lower abdomen and penis with voiding. This is caused by movement of the ends of the stent with the act of urinating.It usually goes away within minutes after you have stopped urinating. HOME CARE INSTRUCTIONS Make sure to drink plenty of fluids. You may have small amounts of bleeding, causing your urine to be red. This is normal. Certain movements may trigger pain or a feeling that you need to urinate. You may be given medicines to prevent infection or bladder spasms. Be sure to take all medicines as directed. Only take over-the-counter or prescription medicines for pain, discomfort, or fever as directed by your health care provider. Do not take aspirin, as this can make bleeding worse.  REMOVAL OF THE STENT: Remove the stent on Wednesday morning, Sep 18, 2014 by pulling the string with slow steady pressure until the entire stent is removed.    Be sure to keep all follow-up appointment in 6 weeks so your health care provider can check that you are healing properly.     SEEK MEDICAL CARE IF:  You experience increasing pain.  Your pain medicine is not working. SEEK IMMEDIATE MEDICAL CARE IF:  Your urine is dark red or has blood clots.  You are leaking urine (incontinent).  You have a fever, chills, feeling sick to your stomach  (nausea), or vomiting.  Your pain is not relieved by pain medicine.  The end of the stent comes out of the urethra.  You are unable to urinate. Document Released: 05/30/2013 Document Revised: 10/02/2013 Document Reviewed: 05/30/2013 [facility] Patient Information 2015 [place], [place]. This information is not intended to replace advice given to you by your health care provider. Make sure you discuss any questions you have with your health care provider.   Post Anesthesia Home Care Instructions  Activity: Get plenty of rest for the remainder of the day. A responsible adult should stay with you for 24 hours following the procedure.  For the next 24 hours, DO NOT: -Drive a car -Paediatric nurse -Drink alcoholic beverages -Take any medication unless instructed by your physician -Make any legal decisions or sign important papers.  Meals: Start with liquid foods such as gelatin or soup. Progress to regular foods as tolerated. Avoid greasy, spicy, heavy foods. If nausea and/or vomiting occur, drink only clear liquids until the nausea and/or vomiting subsides. Call your physician if vomiting continues.  Special Instructions/Symptoms: Your throat may feel dry or sore from the anesthesia or the breathing tube placed in your throat during surgery. If this causes discomfort, gargle with warm salt water. The discomfort should disappear within 24 hours.

## 2014-09-13 NOTE — Anesthesia Procedure Notes (Signed)
Procedure Name: LMA Insertion Date/Time: 09/13/2014 3:13 PM Performed by: Wanita Chamberlain Pre-anesthesia Checklist: Patient identified, Timeout performed, Emergency Drugs available, Suction available and Patient being monitored Patient Re-evaluated:Patient Re-evaluated prior to inductionOxygen Delivery Method: Circle system utilized Preoxygenation: Pre-oxygenation with 100% oxygen Intubation Type: IV induction Ventilation: Mask ventilation without difficulty LMA: LMA inserted LMA Size: 4.0 Number of attempts: 1 Airway Equipment and Method: Bite block Placement Confirmation: positive ETCO2 Tube secured with: Tape Dental Injury: Teeth and Oropharynx as per pre-operative assessment

## 2014-09-13 NOTE — Op Note (Addendum)
Preoperative diagnosis: Left ureteral stone Postoperative diagnosis: Left ureteral stone  Procedure: Cystoscopy, left ureteroscopy, holmium laser lithotripsy, stone basket extraction, ureteral stent exchange  Surgeon: Junious Silk  Anesthesia: Rose  Type of anesthesia: Gen.  Indication for procedure: Patient is a 23 year old male with an impacted left ureteral stone and a very tight intramural ureter and underwent pre-stenting for ureteroscopy. He presents today for definitive stone treatment.  Findings: On left ureteroscopy the intramural ureter was still quite tight but was passively dilated with the rigid ureteroscope. The stone was located in the mid ureter and washed into the dilated proximal ureter. Stone impaction site showed edematous mucosa.  Description of procedure: After consent was obtained patient brought to the operating room. After adequate anesthesia he was placed in lithotomy position and prepped and draped in the usual sterile fashion. A timeout was performed to confirm the patient and procedure. Cystoscope was passed per urethra and the left ureteral stent grasped and removed through the urethral meatus. A sensor wire was advanced and coiled in the collecting system. I then passed the rigid ureteroscope and the intramural ureter was still quite narrow despite the stent. However I was able to get up just over the iliacs but the stone washed proximally. The rigid scope was about hubbed and the intramural ureter tight on the scope therefore I decided to switch to flexible ureteroscopic access. The rigid scope was withdrawn into the distal ureter where I passed a Glidewire under direct vision. I then removed the rigid scope. Over the sensor wire advanced a ureteral access sheath into the mid ureter just to the proximal ureter without any difficulty. The digital ureteroscope was advanced where the stone was noted. It was broken into several pieces at a setting of 0.3 and 20 with the holmium  laser. The pieces were sequentially removed without difficulty. I then advanced the scope proximally from the ureter into the renal pelvis and collecting system and noted no other stones. The collecting system was inspected. The renal pelvis and proximal ureter was normal. The scope was backed out to the sheath both were withdrawn together and the ureter was noted to be without injury without other stone fragments or stricture. The Glidewire was then backloaded on the cystoscope and a 6 x 28 cm stent was advanced. The wire was removed with a good coil seen in the kidney and a good coil in the bladder. The bladder was drained and the scope removed. The string was taped to the penis the patient was awakened and taken to the recovery room in stable condition.  Complications: None Blood loss: Minimal  Drains: 6 x 28 cm left ureteral stent with string  Specimens: Stone fragments given to patient's mom  Disposition: Patient stable to PACU

## 2014-09-16 ENCOUNTER — Encounter (HOSPITAL_BASED_OUTPATIENT_CLINIC_OR_DEPARTMENT_OTHER): Payer: Self-pay | Admitting: Urology

## 2014-09-16 NOTE — Transfer of Care (Signed)
Immediate Anesthesia Transfer of Care Note  Patient: Chris Murphy  Procedure(s) Performed: Procedure(s): CYSTOSCOPY WITH LEFT URETEROSCOPY AND STENT removal and replacement, Basket stone extraction  (Left) HOLMIUM LASER  (Left)  Patient Location: PACU  Anesthesia Type:General  Level of Consciousness: awake, alert , oriented and patient cooperative  Airway & Oxygen Therapy: Patient Spontanous Breathing and Patient connected to nasal cannula oxygen  Post-op Assessment: Report given to PACU RN and Post -op Vital signs reviewed and stable  Post vital signs: Reviewed and stable  Complications: No apparent anesthesia complications

## 2014-12-05 ENCOUNTER — Ambulatory Visit (INDEPENDENT_AMBULATORY_CARE_PROVIDER_SITE_OTHER): Payer: BC Managed Care – PPO | Admitting: Family Medicine

## 2014-12-05 ENCOUNTER — Encounter: Payer: Self-pay | Admitting: Family Medicine

## 2014-12-05 VITALS — BP 120/74 | Ht 73.0 in | Wt 267.0 lb

## 2014-12-05 DIAGNOSIS — F900 Attention-deficit hyperactivity disorder, predominantly inattentive type: Secondary | ICD-10-CM

## 2014-12-05 MED ORDER — METHYLPHENIDATE HCL ER (OSM) 54 MG PO TBCR: 54.0000 mg | EXTENDED_RELEASE_TABLET | ORAL | Status: DC

## 2014-12-05 NOTE — Progress Notes (Signed)
   Subjective:    Patient ID: Chris Murphy, male    DOB: [birth year], 24 y.o.   MRN: [id number]  HPI Patient is here today for a ADHD check up.  He says everything is going well.  Likes the dose and med.  Needs refills.    No concerns Recent kid stones and missed substantial work  Engineer, manufacturing with meds faihfully  definitdely helping   Recent treatment for kidney stone  Review of Systems No headache no chest pain no back pain abdominal pain no change in bowel habits    Objective:   Physical Exam  Alert no acute distress vital stable HET normal neuro exam intact lungs clear heart rhythm.      Assessment & Plan:  Impression ADHD good control plan diet discussed excised discussed maintain same medications. Recheck as scheduled. WSL

## 2014-12-06 ENCOUNTER — Ambulatory Visit: Payer: BC Managed Care – PPO | Admitting: Family Medicine

## 2014-12-12 ENCOUNTER — Encounter: Payer: Self-pay | Admitting: Family Medicine

## 2014-12-12 ENCOUNTER — Ambulatory Visit (INDEPENDENT_AMBULATORY_CARE_PROVIDER_SITE_OTHER): Payer: BC Managed Care – PPO | Admitting: Family Medicine

## 2014-12-12 VITALS — BP 110/78 | Temp 101.4°F | Ht 72.0 in | Wt 258.0 lb

## 2014-12-12 DIAGNOSIS — A084 Viral intestinal infection, unspecified: Secondary | ICD-10-CM

## 2014-12-12 MED ORDER — ONDANSETRON HCL 8 MG PO TABS: 8.0000 mg | ORAL_TABLET | Freq: Three times a day (TID) | ORAL | Status: DC | PRN

## 2014-12-12 NOTE — Progress Notes (Signed)
   Subjective:    Patient ID: Chris Murphy, male    DOB: 04-18-1991, 24 y.o.   MRN: [id number]  Emesis  This is a new problem. Episode onset: March 1st. The maximum temperature recorded prior to his arrival was 101 - 101.9 F. Associated symptoms include abdominal pain, diarrhea and a fever. Pertinent negatives include no chest pain or coughing. Associated symptoms comments: Sore throat. He has tried nothing for the symptoms.      Review of Systems  Constitutional: Positive for fever.  HENT: Negative for congestion.   Respiratory: Negative for cough and shortness of breath.   Cardiovascular: Negative for chest pain.  Gastrointestinal: Positive for vomiting, abdominal pain and diarrhea.       Objective:   Physical Exam  Constitutional: He appears well-nourished. No distress.  Cardiovascular: Normal rate, regular rhythm and normal heart sounds.   No murmur heard. Pulmonary/Chest: Effort normal and breath sounds normal. No respiratory distress.  Abdominal: Soft. There is no tenderness.  Musculoskeletal: He exhibits no edema.  Lymphadenopathy:    He has no cervical adenopathy.  Neurological: He is alert.  Psychiatric: His behavior is normal.  Vitals reviewed.   Abdomen soft nontender      Assessment & Plan:  Gastroenteritis viral syndrome Zofran when necessary Also recommend rest at home next couple days Head post vomiting seizure spell he states this happens [place] the time he was able to come to right away so it really does not sound like epilepsy

## 2015-03-31 ENCOUNTER — Ambulatory Visit (INDEPENDENT_AMBULATORY_CARE_PROVIDER_SITE_OTHER): Payer: BC Managed Care – PPO | Admitting: Family Medicine

## 2015-03-31 ENCOUNTER — Encounter: Payer: Self-pay | Admitting: Family Medicine

## 2015-03-31 VITALS — BP 110/80 | Ht 72.0 in | Wt 270.0 lb

## 2015-03-31 DIAGNOSIS — F900 Attention-deficit hyperactivity disorder, predominantly inattentive type: Secondary | ICD-10-CM

## 2015-03-31 MED ORDER — METHYLPHENIDATE HCL ER (OSM) 54 MG PO TBCR: 54.0000 mg | EXTENDED_RELEASE_TABLET | ORAL | Status: DC

## 2015-03-31 NOTE — Progress Notes (Signed)
   Subjective:    Patient ID: Chris Murphy, male    DOB: [birth year], 24 y.o.   MRN: [id number]  HPI Patient was seen today for ADD checkup. -weight, vital signs reviewed.  The following items were covered. -Compliance with medication : yes  -Problems with completing tasks: none  - Eating patterns : good       -sleeping: good  -Additional issues or questions: none  Good control with the meds  No side effects  Not exercising these days     Review of Systems No headache no chest pain no back pain no abdominal pain no change in bowel habits    Objective:   Physical Exam  Alert vital stable HEENT normal. Obesity present. Lungs clear. Heart regular rate and rhythm.      Assessment & Plan:   Impression chronic ADHD handling medications well. Plan diet discussed exercise discussed medications refilled recheck in 4 months. WSL

## 2015-07-04 ENCOUNTER — Telehealth: Payer: Self-pay | Admitting: Family Medicine

## 2015-07-04 NOTE — Telephone Encounter (Signed)
Rx prior auth APPROVED for pt's methylphenidate 54 MG PO CR tablet, LMOM to notify pt of approval, faxed approval to [facility], valid 06/13/15-07/03/16 through his [facility]  Case# [id number]

## 2015-08-01 ENCOUNTER — Ambulatory Visit (INDEPENDENT_AMBULATORY_CARE_PROVIDER_SITE_OTHER): Payer: BC Managed Care – PPO | Admitting: Family Medicine

## 2015-08-01 ENCOUNTER — Encounter: Payer: Self-pay | Admitting: Family Medicine

## 2015-08-01 VITALS — BP 132/82 | Ht 72.0 in | Wt 268.0 lb

## 2015-08-01 DIAGNOSIS — F900 Attention-deficit hyperactivity disorder, predominantly inattentive type: Secondary | ICD-10-CM

## 2015-08-01 MED ORDER — METHYLPHENIDATE HCL ER (OSM) 54 MG PO TBCR: 54.0000 mg | EXTENDED_RELEASE_TABLET | ORAL | Status: DC

## 2015-08-01 NOTE — Progress Notes (Signed)
   Subjective:    Patient ID: Chris Murphy, male    DOB: Jun 04, 1991, 24 y.o.   MRN: [id number]  HPI Patient Arrives for a follow up on ADHD. Patient currently on Methyllphenidate 54 mg daily. Patient states he is doing well with this med. No obvious side effects with her medication. Handling well. Does not miss a dose. Overall focusing definitely better.  Had a pretty serious kidney stone. Wonders if he needs to see urologist on a regular basis. No urinary symptoms     Review of Systems No dysuria no fever no chills no headache ROS otherwise negative    Objective:   Physical Exam  Alert vitals stable HET normal lungs clear heart regular in rhythm no CVA tenderness abdomen benign neuro intact      Assessment & Plan:  Impression 1 ADHD good control discussed #2 kidney stones no need for follow-up specifically discussed plan medications refilled diet exercise discussed recheck in 4 months WSL

## 2015-10-02 ENCOUNTER — Telehealth: Payer: Self-pay | Admitting: Family Medicine

## 2015-10-02 MED ORDER — ONDANSETRON HCL 8 MG PO TABS: 8.0000 mg | ORAL_TABLET | Freq: Three times a day (TID) | ORAL | Status: DC | PRN

## 2015-10-02 NOTE — Telephone Encounter (Signed)
Notified patient that med was sent to pharmacy.  

## 2015-10-02 NOTE — Addendum Note (Signed)
Addended by: Jesusita Oka on: 10/02/2015 11:40 AM   Modules accepted: Orders

## 2015-10-02 NOTE — Telephone Encounter (Signed)
ondansetron (ZOFRAN) 8 MG tablet  Pt needs a refill on this med please  Stated he asked for this several weeks ago   [place] [facility]

## 2015-10-02 NOTE — Telephone Encounter (Signed)
Ok ref times three 24 q8hrs prn

## 2015-11-11 ENCOUNTER — Encounter: Payer: Self-pay | Admitting: Family Medicine

## 2015-11-11 ENCOUNTER — Ambulatory Visit (INDEPENDENT_AMBULATORY_CARE_PROVIDER_SITE_OTHER): Payer: BC Managed Care – PPO | Admitting: Family Medicine

## 2015-11-11 VITALS — BP 126/76 | Ht 72.0 in | Wt 270.1 lb

## 2015-11-11 DIAGNOSIS — F900 Attention-deficit hyperactivity disorder, predominantly inattentive type: Secondary | ICD-10-CM

## 2015-11-11 MED ORDER — METHYLPHENIDATE HCL ER (OSM) 54 MG PO TBCR: 54.0000 mg | EXTENDED_RELEASE_TABLET | ORAL | Status: DC

## 2015-11-11 NOTE — Progress Notes (Signed)
   Subjective:    Patient ID: Chris Murphy, male    DOB: Feb 28, 1991, 25 y.o.   MRN: [id number]  HPI  Patient was seen today for ADD checkup. -weight, vital signs reviewed.  The following items were covered. -Compliance with medication : Yes , takes every morning.  -Problems with completing homework, paying attention/taking good notes in school: Patient states no problems concentrating.  -grades: N/A  - Eating patterns : Patient states eating is fair, would like to eat less.  -sleeping: Patient states sleeps about 8 hrs a night.  -Additional issues or questions: Patient has concerns of left arm falling asleep about once a twice a month for about 5 minutes with no specific aggravating factors.   Left arm falls asleep, does not occur at night  Develops during day,  Exercise is pretty godd with three times per wk with the [employer]  Staying on track with things with the adhd meds  Review of Systems  no headache no chest pain no back pain no abdominal pain    Objective:   Physical Exam  alert vitals stable. Neurological exam intact. HEENT normal. Lungs clear heart regular in rhythm left arm valve vasculature Per-fit sensation strength all intact       Assessment & Plan:   impression 1 ADHD good control #2 intermittent neurovascular tingling numbness left arm positional when driving vehicle. No sign of anything serious discussed the just arm position plan diet exercise discussed maintain same meds refilled recheck in 4 months WSL

## 2016-03-16 ENCOUNTER — Ambulatory Visit: Payer: BC Managed Care – PPO | Admitting: Family Medicine

## 2016-03-22 ENCOUNTER — Ambulatory Visit (INDEPENDENT_AMBULATORY_CARE_PROVIDER_SITE_OTHER): Payer: BC Managed Care – PPO | Admitting: Family Medicine

## 2016-03-22 ENCOUNTER — Encounter: Payer: Self-pay | Admitting: Family Medicine

## 2016-03-22 VITALS — BP 110/74 | Ht 72.0 in | Wt 265.5 lb

## 2016-03-22 DIAGNOSIS — K219 Gastro-esophageal reflux disease without esophagitis: Secondary | ICD-10-CM

## 2016-03-22 DIAGNOSIS — F902 Attention-deficit hyperactivity disorder, combined type: Secondary | ICD-10-CM | POA: Diagnosis not present

## 2016-03-22 MED ORDER — METHYLPHENIDATE HCL ER (OSM) 54 MG PO TBCR: 54.0000 mg | EXTENDED_RELEASE_TABLET | ORAL | Status: DC

## 2016-03-22 NOTE — Progress Notes (Signed)
   Subjective:    Patient ID: Chris Murphy, male    DOB: Dec 29, 1990, 25 y.o.   MRN: [id number]  HPI Patient was seen today for ADD checkup. -weight, vital signs reviewed.  The following items were covered. -Compliance with medication: yes  -Problems with completing tasks: none  -Eating patterns: good  -sleeping: good  -Additional issues or questions: Patient states that he is having issues with ac/id reflux. Vomiting noted also. Onset 3 weeks ago. /has suffered reflux th epas t few weeks, rotten and sour taste, even almost wantw to vomit  Felt bad with it, vom twice with flares and vom two or three times  Eats pretty siil  Takes prevacid full time, wonders if being arounc folks who smoke with sig exposure to nicotine   Takes the prevcid once per day in the morning   No sic  Patient also wants to know if second hand smoke could be contributing to this problem.   Review of Systems No headache, no major weight loss or weight gain, no chest pain no back pain abdominal pain no change in bowel habits complete ROS otherwise negative     Objective:   Physical Exam  Alert vitals stable. No acute distress. Lungs clear. Heart rare rhythm. Epigastrium mild tenderness to deep palpation no rebound no guarding no masses      Assessment & Plan:  Impression 1 ADHD good control #2 flare of esophagitis/gastritis/reflux plan maintain Prevacid each day for next 30 then when necessary. ADHD meds refilled diet exercise discussed recheck 4 months

## 2016-03-30 ENCOUNTER — Telehealth: Payer: Self-pay | Admitting: Family Medicine

## 2016-03-30 NOTE — Telephone Encounter (Signed)
Transferred patient to front desk for appointment for tomorrow.

## 2016-03-30 NOTE — Telephone Encounter (Signed)
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 He wants to know if you need  To see him or should he do something different with his meds?   Please advise, pt went back to work

## 2016-03-30 NOTE — Telephone Encounter (Signed)
Ntsw. Ov tom with me.

## 2016-03-30 NOTE — Telephone Encounter (Signed)
Or late this afternoon

## 2016-03-31 ENCOUNTER — Encounter: Payer: Self-pay | Admitting: Family Medicine

## 2016-03-31 ENCOUNTER — Ambulatory Visit (INDEPENDENT_AMBULATORY_CARE_PROVIDER_SITE_OTHER): Payer: BC Managed Care – PPO | Admitting: Family Medicine

## 2016-03-31 VITALS — BP 110/70 | Ht 72.0 in | Wt 262.5 lb

## 2016-03-31 DIAGNOSIS — R55 Syncope and collapse: Secondary | ICD-10-CM | POA: Diagnosis not present

## 2016-03-31 DIAGNOSIS — R1111 Vomiting without nausea: Secondary | ICD-10-CM | POA: Diagnosis not present

## 2016-03-31 DIAGNOSIS — R1011 Right upper quadrant pain: Principal | ICD-10-CM

## 2016-03-31 DIAGNOSIS — G8929 Other chronic pain: Secondary | ICD-10-CM

## 2016-03-31 DIAGNOSIS — R1010 Upper abdominal pain, unspecified: Secondary | ICD-10-CM

## 2016-03-31 DIAGNOSIS — R569 Unspecified convulsions: Secondary | ICD-10-CM | POA: Diagnosis not present

## 2016-03-31 MED ORDER — PANTOPRAZOLE SODIUM 40 MG PO TBEC: 40.0000 mg | DELAYED_RELEASE_TABLET | Freq: Every day | ORAL | Status: DC

## 2016-03-31 MED ORDER — SUCRALFATE 1 G PO TABS: 1.0000 g | ORAL_TABLET | Freq: Three times a day (TID) | ORAL | Status: DC

## 2016-03-31 NOTE — Progress Notes (Signed)
   Subjective:    Patient ID: Chris Murphy, male    DOB: 06-Dec-1990, 25 y.o.   MRN: [id number]  Seizures  This is a recurrent problem. There was 1 seizure. The most recent episode lasted less than 30 seconds. Characteristics include rhythmic jerking. The episode was witnessed.   vomitid and even tho prevacid  Developed sour burps arouncd seven o clock  Had challenge with nausea.patient continues experience upper abdominal discomfort. Burning worse after certain meals Particularly flare with greasy foods. Next  Computed by nausea and subsequent vomiting. Next  Strong family history of many in the family who vomited and thn proceed to pass out.this happened the patientjust last night   when the patient passed out he had seizure-like activity that lasted for about a minute. Of note this happened in the remote past it was misdiagnosed as a true seizure. Instead I evaluated patient the past and felt strongly he had vasovagal episodes with seizure-like activity.    Patient also has concerns of acid reflux and vomiting.  States no other concerns this visit.  Review of Systems  Neurological: Positive for seizures.       Objective:   Physical Exam Alert vital stable talkative no acute distress neuro exam intact no focl deficits lungs clear heart rare rhythm abdomen mildepigastric dscomfort       Assessment & Plan:   impression reflux versus gallbladder disease versus non-diagnoy with secondary vomiting #2 vasovagal episodes with seizure-like activity. Long discussion held with patient why this is not true seizures L[place] switch to Protonix. Add Carafate 4 times a day right upper quadrant abdominal ultrasound symptom care discussed 25 minutes spent most in discussion WSL

## 2016-04-07 ENCOUNTER — Ambulatory Visit (HOSPITAL_COMMUNITY): Payer: BC Managed Care – PPO

## 2016-04-08 ENCOUNTER — Encounter: Payer: Self-pay | Admitting: Family Medicine

## 2016-04-08 ENCOUNTER — Ambulatory Visit (HOSPITAL_COMMUNITY)
Admission: RE | Admit: 2016-04-08 | Discharge: 2016-04-08 | Disposition: A | Discharge status: Home / Self Care | Payer: BC Managed Care – PPO | Source: Ambulatory Visit | Attending: Family Medicine | Admitting: Family Medicine

## 2016-04-08 ENCOUNTER — Encounter (INDEPENDENT_AMBULATORY_CARE_PROVIDER_SITE_OTHER): Payer: Self-pay | Admitting: Internal Medicine

## 2016-04-08 DIAGNOSIS — R1010 Upper abdominal pain, unspecified: Secondary | ICD-10-CM | POA: Diagnosis not present

## 2016-04-08 DIAGNOSIS — R1111 Vomiting without nausea: Secondary | ICD-10-CM | POA: Insufficient documentation

## 2016-04-08 DIAGNOSIS — G8929 Other chronic pain: Secondary | ICD-10-CM | POA: Diagnosis present

## 2016-04-08 NOTE — Addendum Note (Signed)
Addended by: Launa Grill on: 04/08/2016 09:47 AM   Modules accepted: Orders

## 2016-04-21 ENCOUNTER — Telehealth: Payer: Self-pay | Admitting: Family Medicine

## 2016-04-21 NOTE — Telephone Encounter (Signed)
Last filled on 6/19

## 2016-04-21 NOTE — Telephone Encounter (Signed)
methylphenidate 54 MG PO CR tablet   Pt states that he is leaving to go out of the country on the 16th around 5 am He needs this med approved to be filled on the 15th if possible so that he  Can take it with him. He states the pharmacy said he had to call here to  Get early approval  [place] [facility]

## 2016-04-21 NOTE — Telephone Encounter (Signed)
yes

## 2016-04-21 NOTE — Telephone Encounter (Signed)
Pharmacist at [facility] drug notified that it is ok to fill medication 04/24/16 per Dr Richardson Landry.  Patient notified.

## 2016-04-29 ENCOUNTER — Encounter (INDEPENDENT_AMBULATORY_CARE_PROVIDER_SITE_OTHER): Payer: Self-pay | Admitting: Internal Medicine

## 2016-04-29 ENCOUNTER — Ambulatory Visit (INDEPENDENT_AMBULATORY_CARE_PROVIDER_SITE_OTHER): Payer: BC Managed Care – PPO | Admitting: Internal Medicine

## 2016-06-18 ENCOUNTER — Telehealth: Payer: Self-pay | Admitting: Family Medicine

## 2016-06-18 NOTE — Telephone Encounter (Signed)
Pt called requesting another referral to GI Looked & pt just did miss an appointment in July 2017, no new referral needed Called pt back to let him know that nothing is needed from our office, he may call & schedule Gave ph# to Dr. Olevia Perches office Pt verbalized understanding

## 2016-06-22 ENCOUNTER — Encounter (INDEPENDENT_AMBULATORY_CARE_PROVIDER_SITE_OTHER): Payer: Self-pay | Admitting: Internal Medicine

## 2016-06-22 ENCOUNTER — Ambulatory Visit (INDEPENDENT_AMBULATORY_CARE_PROVIDER_SITE_OTHER): Payer: BLUE CROSS/BLUE SHIELD | Admitting: Internal Medicine

## 2016-06-22 VITALS — BP 122/70 | HR 92 | Temp 97.9°F | Ht 72.0 in | Wt 256.1 lb

## 2016-06-22 DIAGNOSIS — R112 Nausea with vomiting, unspecified: Secondary | ICD-10-CM | POA: Diagnosis not present

## 2016-06-22 NOTE — Patient Instructions (Signed)
HIDA scan. CBC, Hepatic function.

## 2016-06-22 NOTE — Progress Notes (Signed)
Subjective:    Patient ID: Chris Murphy, male    DOB: [birth year], 25 y.o.   MRN: [id number]  HPI Referred by Dr. Wolfgang Phoenix for burping and  vomiting. He says sometimes he will wake up in the morning and have rotten burps. He says usually for the past year or 2 and will last about 24 hours. After he vomits he feels better. This has occurred x 2 in the past 2 weeks. He did not have symptoms in July. Occurred twice in August and twice in September.   He is trying to avoid tomato based foods and spicy foods.  He avoids soup.  He changed his diet about a month ago after returning from a trip to [place]. Today he feels great.  Long hx of same. [place] RUQ in June for same was normal.  He says he feels good today. His appetite is good. No weight loss. He usually has a BM x 2-3 times a day. Stools are formed. During his episodes of burping, he will have diarrhea.  He denies acid reflux.         Review of Systems Past Medical History:  Diagnosis Date  . ADHD (attention deficit hyperactivity disorder)   . GERD (gastroesophageal reflux disease)    watches diet  . History of idiopathic seizure    hx 2006 seizures w/ vomiting; per pt had work-up unknown etiology/  none until may 2015 x1 seizure w/ vomiting    . History of syncope    vasovagal episode  . Left ureteral calculus   . Wears glasses     Past Surgical History:  Procedure Laterality Date  . CYSTOSCOPY WITH RETROGRADE PYELOGRAM, URETEROSCOPY AND STENT PLACEMENT Left 09/03/2014   Procedure: CYSTOSCOPY WITH LEFT RETROGRADE PYELOGRAM, LEFT URETEROSCOPY AND  DOUBLE J STENT PLACEMENT;  Surgeon: Festus Aloe, MD;  Location: [facility];  Service: Urology;  Laterality: Left;  . CYSTOSCOPY WITH URETEROSCOPY AND STENT PLACEMENT Left 09/13/2014   Procedure: CYSTOSCOPY WITH LEFT URETEROSCOPY AND STENT removal and replacement, Basket stone extraction ;  Surgeon: Festus Aloe, MD;  Location: [facility];  Service:  Urology;  Laterality: Left;  . EXTRACORPOREAL SHOCK WAVE LITHOTRIPSY Left left 07-04-2014  &  2005  . HOLMIUM LASER APPLICATION Left 0000000   Procedure: HOLMIUM LASER ;  Surgeon: Festus Aloe, MD;  Location: [facility];  Service: Urology;  Laterality: Left;  . KNEE ARTHROSCOPY / MENISECTOMY/ SYNOVECTOMY/ DEBRIDEMENT PARTIAL ACL TEAR Right 06-28-2001    No Known Allergies  Current Outpatient Prescriptions on File Prior to Visit  Medication Sig Dispense Refill  . methylphenidate 54 MG PO CR tablet Take 1 tablet (54 mg total) by mouth every morning. 30 tablet 0  . pantoprazole (PROTONIX) 40 MG tablet Take 1 tablet (40 mg total) by mouth daily. 30 tablet 5  . sucralfate (CARAFATE) 1 g tablet Take 1 tablet (1 g total) by mouth 4 (four) times daily -  with meals and at bedtime. (Patient not taking: Reported on 06/22/2016) 120 tablet 2   No current facility-administered medications on file prior to visit.        Objective:   Physical Exam Blood pressure 122/70, pulse 92, temperature 97.9 F (36.6 C), height 6' (1.829 m), weight 256 lb 1.6 oz (116.2 kg).  Alert and oriented. Skin warm and dry. Oral mucosa is moist.   . Sclera anicteric, conjunctivae is pink. Thyroid not enlarged. No cervical lymphadenopathy. Lungs clear. Heart regular rate and rhythm.  Abdomen  is soft. Bowel sounds are positive. No hepatomegaly. No abdominal masses felt. No tenderness.  No edema to lower extremities.         Assessment & Plan:  Burping, Nausea.  Will rule out GB disease. Hepatic function, CBC, HIDA scan.  Further recommendations to follow.   May need an EGD ?

## 2016-06-28 ENCOUNTER — Encounter (INDEPENDENT_AMBULATORY_CARE_PROVIDER_SITE_OTHER): Payer: Self-pay | Admitting: *Deleted

## 2016-06-29 ENCOUNTER — Encounter (HOSPITAL_COMMUNITY)
Admission: RE | Admit: 2016-06-29 | Discharge: 2016-06-29 | Disposition: A | Discharge status: Home / Self Care | Payer: BLUE CROSS/BLUE SHIELD | Source: Ambulatory Visit | Attending: Internal Medicine | Admitting: Internal Medicine

## 2016-06-29 ENCOUNTER — Other Ambulatory Visit (INDEPENDENT_AMBULATORY_CARE_PROVIDER_SITE_OTHER): Payer: Self-pay | Admitting: Internal Medicine

## 2016-06-29 ENCOUNTER — Encounter (HOSPITAL_COMMUNITY): Payer: Self-pay

## 2016-06-29 DIAGNOSIS — R112 Nausea with vomiting, unspecified: Secondary | ICD-10-CM | POA: Insufficient documentation

## 2016-06-29 MED ORDER — TECHNETIUM TC 99M MEBROFENIN IV KIT
5.0000 | PACK | Freq: Once | INTRAVENOUS | Status: AC | PRN
  Administered 2016-06-29: 5 via INTRAVENOUS

## 2016-06-30 ENCOUNTER — Telehealth (INDEPENDENT_AMBULATORY_CARE_PROVIDER_SITE_OTHER): Payer: Self-pay | Admitting: Internal Medicine

## 2016-06-30 ENCOUNTER — Encounter (INDEPENDENT_AMBULATORY_CARE_PROVIDER_SITE_OTHER): Payer: Self-pay | Admitting: Internal Medicine

## 2016-06-30 NOTE — Telephone Encounter (Signed)
Needs OV in 8 weeks not 3 months.

## 2016-06-30 NOTE — Progress Notes (Signed)
Patient was given an appointment for 08/25/16 at 9:00am.  A letter was mailed to the patient.

## 2016-06-30 NOTE — Telephone Encounter (Signed)
I have talked with patient. I advised him his GB was not working like it should.  He wants to wait till OV in 3 months to discuss. He promised if his symptoms returned, he would let me know and I would do the referral to a surgeon.

## 2016-06-30 NOTE — Telephone Encounter (Signed)
Hope, Needs OV in 3 months.

## 2016-07-08 ENCOUNTER — Telehealth: Payer: Self-pay | Admitting: *Deleted

## 2016-07-08 ENCOUNTER — Other Ambulatory Visit: Payer: Self-pay | Admitting: *Deleted

## 2016-07-08 NOTE — Telephone Encounter (Signed)
PA for methylphenidate tablets. Insurance will only cover name brand concerta. Called pharm and they had script. Did not need new one. Pt notifed.

## 2016-07-12 ENCOUNTER — Telehealth (INDEPENDENT_AMBULATORY_CARE_PROVIDER_SITE_OTHER): Payer: Self-pay | Admitting: Internal Medicine

## 2016-07-12 ENCOUNTER — Other Ambulatory Visit (INDEPENDENT_AMBULATORY_CARE_PROVIDER_SITE_OTHER): Payer: Self-pay | Admitting: Internal Medicine

## 2016-07-12 DIAGNOSIS — R948 Abnormal results of function studies of other organs and systems: Secondary | ICD-10-CM

## 2016-07-12 NOTE — Telephone Encounter (Signed)
Patient called, stated that he had another episode and he is ready for a surgical consult.  [phone]

## 2016-07-12 NOTE — Telephone Encounter (Signed)
Referral to Dr. Jenkins 

## 2016-07-19 ENCOUNTER — Ambulatory Visit: Payer: Self-pay | Admitting: Family Medicine

## 2016-07-29 ENCOUNTER — Encounter (HOSPITAL_COMMUNITY): Payer: Self-pay

## 2016-07-29 ENCOUNTER — Encounter (HOSPITAL_COMMUNITY)
Admission: RE | Admit: 2016-07-29 | Discharge: 2016-07-29 | Disposition: A | Discharge status: Home / Self Care | Payer: BLUE CROSS/BLUE SHIELD | Source: Ambulatory Visit | Attending: General Surgery | Admitting: General Surgery

## 2016-07-29 DIAGNOSIS — K811 Chronic cholecystitis: Secondary | ICD-10-CM | POA: Insufficient documentation

## 2016-07-29 LAB — HEPATIC FUNCTION PANEL
ALK PHOS: 86 U/L (ref 38–126)
ALT: 38 U/L (ref 17–63)
AST: 21 U/L (ref 15–41)
Albumin: 4.3 g/dL (ref 3.5–5.0)
BILIRUBIN DIRECT: 0.1 mg/dL (ref 0.1–0.5)
BILIRUBIN INDIRECT: 0.5 mg/dL (ref 0.3–0.9)
BILIRUBIN TOTAL: 0.6 mg/dL (ref 0.3–1.2)
TOTAL PROTEIN: 7.9 g/dL (ref 6.5–8.1)

## 2016-07-29 LAB — CBC WITH DIFFERENTIAL/PLATELET
BASOS ABS: 0 10*3/uL (ref 0.0–0.1)
BASOS PCT: 0 %
EOS ABS: 0.7 10*3/uL (ref 0.0–0.7)
EOS PCT: 8 %
HCT: 43.3 % (ref 39.0–52.0)
Hemoglobin: 14.9 g/dL (ref 13.0–17.0)
LYMPHS PCT: 32 %
Lymphs Abs: 2.8 10*3/uL (ref 0.7–4.0)
MCH: 28.2 pg (ref 26.0–34.0)
MCHC: 34.4 g/dL (ref 30.0–36.0)
MCV: 82 fL (ref 78.0–100.0)
MONO ABS: 0.6 10*3/uL (ref 0.1–1.0)
Monocytes Relative: 7 %
Neutro Abs: 4.6 10*3/uL (ref 1.7–7.7)
Neutrophils Relative %: 53 %
PLATELETS: 238 10*3/uL (ref 150–400)
RBC: 5.28 MIL/uL (ref 4.22–5.81)
RDW: 13.3 % (ref 11.5–15.5)
WBC: 8.8 10*3/uL (ref 4.0–10.5)

## 2016-07-29 LAB — BASIC METABOLIC PANEL
Anion gap: 7 (ref 5–15)
BUN: 16 mg/dL (ref 6–20)
CALCIUM: 9.3 mg/dL (ref 8.9–10.3)
CO2: 26 mmol/L (ref 22–32)
CREATININE: 0.8 mg/dL (ref 0.61–1.24)
Chloride: 103 mmol/L (ref 101–111)
GFR calc Af Amer: >60 mL/min (ref >60)
GLUCOSE: 95 mg/dL (ref 65–99)
POTASSIUM: 4.1 mmol/L (ref 3.5–5.1)
SODIUM: 136 mmol/L (ref 135–145)

## 2016-07-29 NOTE — Patient Instructions (Signed)
Chris Murphy  07/29/2016     @PREFPERIOPPHARMACY @   Your procedure is scheduled on  08/02/2016   Report to [facility] at  70  A.M.  Call this number if you have problems the morning of surgery:  [phone]   Remember:  Do not eat food or drink liquids after midnight.  Take these medicines the morning of surgery with A SIP OF WATER  Methylphenidate, protonix.   Do not wear jewelry, make-up or nail polish.  Do not wear lotions, powders, or perfumes, or deoderant.  Do not shave 48 hours prior to surgery.  Men may shave face and neck.  Do not bring valuables to the hospital.  [facility] is not responsible for any belongings or valuables.  Contacts, dentures or bridgework may not be worn into surgery.  Leave your suitcase in the car.  After surgery it may be brought to your room.  For patients admitted to the hospital, discharge time will be determined by your treatment team.  Patients discharged the day of surgery will not be allowed to drive home.   Name and phone number of your driver:   family Special instructions:  none  Please read over the following fact sheets that you were given. Anesthesia Post-op Instructions and Care and Recovery After Surgery       Laparoscopic Cholecystectomy Laparoscopic cholecystectomy is surgery to remove the gallbladder. The gallbladder is located in the upper right part of the abdomen, behind the liver. It is a storage sac for bile, which is produced in the liver. Bile aids in the digestion and absorption of fats. Cholecystectomy is often done for inflammation of the gallbladder (cholecystitis). This condition is usually caused by a buildup of gallstones (cholelithiasis) in the gallbladder. Gallstones can block the flow of bile, and that can result in inflammation and pain. In severe cases, emergency surgery may be required. If emergency surgery is not required, you will have time to prepare for the  procedure. Laparoscopic surgery is an alternative to open surgery. Laparoscopic surgery has a shorter recovery time. Your common bile duct may also need to be examined during the procedure. If stones are found in the common bile duct, they may be removed. LET [facility] CARE PROVIDER KNOW ABOUT:  Any allergies you have.  All medicines you are taking, including vitamins, herbs, eye drops, creams, and over-the-counter medicines.  Previous problems you or members of your family have had with the use of anesthetics.  Any blood disorders you have.  Previous surgeries you have had.  Any medical conditions you have. RISKS AND COMPLICATIONS Generally, this is a safe procedure. However, problems may occur, including:  Infection.  Bleeding.  Allergic reactions to medicines.  Damage to other structures or organs.  A stone remaining in the common bile duct.  A bile leak from the cyst duct that is clipped when your gallbladder is removed.  The need to convert to open surgery, which requires a larger incision in the abdomen. This may be necessary if your surgeon thinks that it is not safe to continue with a laparoscopic procedure. BEFORE THE PROCEDURE  Ask your health care provider about:  Changing or stopping your regular medicines. This is especially important if you are taking diabetes medicines or blood thinners.  Taking medicines such as aspirin and ibuprofen. These medicines can thin your blood. Do not take these medicines before your procedure if your health care provider instructs  you not to.  Follow instructions from your health care provider about eating or drinking restrictions.  Let your health care provider know if you develop a cold or an infection before surgery.  Plan to have someone take you home after the procedure.  Ask your health care provider how your surgical site will be marked or identified.  You may be given antibiotic medicine to help prevent  infection. PROCEDURE  To reduce your risk of infection:  Your health care team will wash or sanitize their hands.  Your skin will be washed with soap.  An IV tube may be inserted into one of your veins.  You will be given a medicine to make you fall asleep (general anesthetic).  A breathing tube will be placed in your mouth.  The surgeon will make several small cuts (incisions) in your abdomen.  A thin, lighted tube (laparoscope) that has a tiny camera on the end will be inserted through one of the small incisions. The camera on the laparoscope will send a picture to a TV screen (monitor) in the operating room. This will give the surgeon a good view inside your abdomen.  A gas will be pumped into your abdomen. This will expand your abdomen to give the surgeon more room to perform the surgery.  Other tools that are needed for the procedure will be inserted through the other incisions. The gallbladder will be removed through one of the incisions.  After your gallbladder has been removed, the incisions will be closed with stitches (sutures), staples, or skin glue.  Your incisions may be covered with a bandage (dressing). The procedure may vary among health care providers and hospitals. AFTER THE PROCEDURE  Your blood pressure, heart rate, breathing rate, and blood oxygen level will be monitored often until the medicines you were given have worn off.  You will be given medicines as needed to control your pain.   This information is not intended to replace advice given to you by your health care provider. Make sure you discuss any questions you have with your health care provider.   Document Released: 09/27/2005 Document Revised: 06/18/2015 Document Reviewed: 05/09/2013 Elsevier Interactive Patient Education 2016 [employer]  Laparoscopic Cholecystectomy, Care After These instructions give you information about caring for yourself after your procedure. Your doctor may also give  you more specific instructions. Call your doctor if you have any problems or questions after your procedure. HOME CARE Incision Care  Follow instructions from your doctor about how to take care of your cuts from surgery (incisions). Make sure you:  Wash your hands with soap and water before you change your bandage (dressing). If you cannot use soap and water, use hand sanitizer.  Change your bandage as told by your doctor.  Leave stitches (sutures), skin glue, or skin tape (adhesive) strips in place. They may need to stay in place for 2 weeks or longer. If tape strips get loose and curl up, you may trim the loose edges. Do not remove tape strips completely unless your doctor says it is okay.  Do not take baths, swim, or use a hot tub until your doctor says it is okay. Ask your doctor if you can take showers. You may only be allowed to take sponge baths. General Instructions  Take over-the-counter and prescription medicines only as told by your doctor.  Do not drive or use heavy machinery while taking prescription pain medicine.  Return to your normal diet as told by your doctor.  Do  not lift anything that is heavier than 10 lb (4.5 kg).  Do not play contact sports for 1 week or until your doctor says it is okay. GET HELP IF:  You have redness, swelling, or pain at the site of your surgical cuts.  You have fluid, blood, or pus coming from your cuts.  You notice a bad smell coming from your cut area.  Your surgical cuts break open.  You have a fever. GET HELP RIGHT AWAY IF:   You have a rash.  You have trouble breathing.  You have chest pain.  You have pain in your shoulders (shoulder strap areas) that is getting worse.  You pass out (faint) or feel dizzy while you are standing.  You have very bad pain in your belly (abdomen).  You feel sick to your stomach (nauseous) or throw up (vomit) for more than 1 day.   This information is not intended to replace advice given  to you by your health care provider. Make sure you discuss any questions you have with your health care provider.   Document Released: 07/06/2008 Document Revised: 06/18/2015 Document Reviewed: 05/09/2013 Elsevier Interactive Patient Education 2016 [place] Anesthesia, Adult General anesthesia is a sleep-like state of non-feeling produced by medicines (anesthetics). General anesthesia prevents you from being alert and feeling pain during a medical procedure. Your caregiver may recommend general anesthesia if your procedure:  Is long.  Is painful or uncomfortable.  Would be frightening to see or hear.  Requires you to be still.  Affects your breathing.  Causes significant blood loss. LET YOUR CAREGIVER KNOW ABOUT:  Allergies to food or medicine.  Medicines taken, including vitamins, herbs, eyedrops, over-the-counter medicines, and creams.  Use of steroids (by mouth or creams).  Previous problems with anesthetics or numbing medicines, including problems experienced by relatives.  History of bleeding problems or blood clots.  Previous surgeries and types of anesthetics received.  Possibility of pregnancy, if this applies.  Use of cigarettes, alcohol, or illegal drugs.  Any health condition(s), especially diabetes, sleep apnea, and high blood pressure. RISKS AND COMPLICATIONS General anesthesia rarely causes complications. However, if complications do occur, they can be life threatening. Complications include:  A lung infection.  A stroke.  A heart attack.  Waking up during the procedure. When this occurs, the patient may be unable to move and communicate that he or she is awake. The patient may feel severe pain. Older adults and adults with serious medical problems are more likely to have complications than adults who are young and healthy. Some complications can be prevented by answering all of your caregiver's questions thoroughly and by following all  pre-procedure instructions. It is important to tell your caregiver if any of the pre-procedure instructions, especially those related to diet, were not followed. Any food or liquid in the stomach can cause problems when you are under general anesthesia. BEFORE THE PROCEDURE  Ask your caregiver if you will have to spend the night at the hospital. If you will not have to spend the night, arrange to have an adult drive you and stay with you for 24 hours.  Follow your caregiver's instructions if you are taking dietary supplements or medicines. Your caregiver may tell you to stop taking them or to reduce your dosage.  Do not smoke for as long as possible before your procedure. If possible, stop smoking 3-6 weeks before the procedure.  Do not take new dietary supplements or medicines within 1 week of your procedure  unless your caregiver approves them.  Do not eat within 8 hours of your procedure or as directed by your caregiver. Drink only clear liquids, such as water, black coffee (without milk or cream), and fruit juices (without pulp).  Do not drink within 3 hours of your procedure or as directed by your caregiver.  You may brush your teeth on the morning of the procedure, but make sure to spit out the toothpaste and water when finished. PROCEDURE  You will receive anesthetics through a mask, through an intravenous (IV) access tube, or through both. A doctor who specializes in anesthesia (anesthesiologist) or a nurse who specializes in anesthesia (nurse anesthetist) or both will stay with you throughout the procedure to make sure you remain unconscious. He or she will also watch your blood pressure, pulse, and oxygen levels to make sure that the anesthetics do not cause any problems. Once you are asleep, a breathing tube or mask may be used to help you breathe. AFTER THE PROCEDURE You will wake up after the procedure is complete. You may be in the room where the procedure was performed or in a  recovery area. You may have a sore throat if a breathing tube was used. You may also feel:  Dizzy.  Weak.  Drowsy.  Confused.  Nauseous.  Cold. These are all normal responses and can be expected to last for up to 24 hours after the procedure is complete. A caregiver will tell you when you are ready to go home. This will usually be when you are fully awake and in stable condition.   This information is not intended to replace advice given to you by your health care provider. Make sure you discuss any questions you have with your health care provider.   Document Released: 01/04/2008 Document Revised: 10/18/2014 Document Reviewed: 01/26/2012 Elsevier Interactive Patient Education 2016 [place] Anesthesia, Adult, Care After Refer to this sheet in the next few weeks. These instructions provide you with information on caring for yourself after your procedure. Your health care provider may also give you more specific instructions. Your treatment has been planned according to current medical practices, but problems sometimes occur. Call your health care provider if you have any problems or questions after your procedure. WHAT TO EXPECT AFTER THE PROCEDURE After the procedure, it is typical to experience:  Sleepiness.  Nausea and vomiting. HOME CARE INSTRUCTIONS  For the first 24 hours after general anesthesia:  Have a responsible person with you.  Do not drive a car. If you are alone, do not take public transportation.  Do not drink alcohol.  Do not take medicine that has not been prescribed by your health care provider.  Do not sign important papers or make important decisions.  You may resume a normal diet and activities as directed by your health care provider.  Change bandages (dressings) as directed.  If you have questions or problems that seem related to general anesthesia, call the hospital and ask for the anesthetist or anesthesiologist on call. SEEK  MEDICAL CARE IF:  You have nausea and vomiting that continue the day after anesthesia.  You develop a rash. SEEK IMMEDIATE MEDICAL CARE IF:   You have difficulty breathing.  You have chest pain.  You have any allergic problems.   This information is not intended to replace advice given to you by your health care provider. Make sure you discuss any questions you have with your health care provider.   Document Released: 01/03/2001 Document Revised:  10/18/2014 Document Reviewed: 01/26/2012 Elsevier Interactive Patient Education [employer].

## 2016-07-29 NOTE — H&P (Signed)
  NTS SOAP Note  Vital Signs:  Vitals as of: 99991111: Systolic 0000000: Diastolic 73: Heart Rate 77: Temp 97.40F (Temporal): Height 31ft 0in: Weight 202Lbs 0 Ounces: BMI 27.4   BMI : 27.4 kg/m2  Subjective: This 25 year old male presents for of epigastric bloating, nausea, and vomiting.  Referred by Dr. Laural Golden.  Has been having intermittent episodes of epigastric pain (4/10), nausea, bloating, and emesis for the past few months.  Did have some fatty food intolerance, but adjusted his diet.  Episodes occur twice a week, random, and resolve spontaneously.  No fever, chills, jaundice.  Currently feels fine.  Review of Symptoms:  Constitutional:negative Head:negative Eyes:negative Nose/Mouth/Throat:negative Cardiovascular:negative Respiratory:negative Gastrointestinnausea, vomiting, heartburn, dyspepsia Genitourinary:negative Musculoskeletal:negative Skin:negative Hematolgic/Lymphatic:negative Allergic/Immunologic:negative   Past Medical History:Reviewed  Past Medical History  Surgical History: ACL surgery, ureteroscopy Medical Problems: ADHD Allergies: nkda Medications: concerta   Social History:Reviewed  Social History  Preferred Language: English Race:  White Ethnicity: Not Hispanic / Latino Age: 22 year Marital Status:  M Alcohol: no   Smoking Status: Never smoker reviewed on 07/29/2016 Functional Status reviewed on 07/29/2016 ------------------------------------------------ Bathing: Normal Cooking: Normal Dressing: Normal Driving: Normal Eating: Normal Managing Meds: Normal Oral Care: Normal Shopping: Normal Toileting: Normal Transferring: Normal Walking: Normal Cognitive Status reviewed on 07/29/2016 ------------------------------------------------ Attention: Normal Decision Making: Normal Language: Normal Memory: Normal Motor: Normal Perception: Normal Problem Solving: Normal Visual and Spatial: Normal   Family  History:Reviewed  Family Health History Mother, Healthy;  Father, Healthy;     Objective Information: General:Well appearing, well nourished in no distress. Skin:no rash or prominent lesions Head:Atraumatic; no masses; no abnormalities Neck:Supple without lymphadenopathy.  Heart:RRR, no murmur or gallop.  Normal S1, S2.  No S3, S4.  Lungs:CTA bilaterally, no wheezes, rhonchi, rales.  Breathing unlabored. Abdomen:Soft, NT/ND, normal bowel sounds, no HSM, no masses.  No peritoneal signs. Dr. Olevia Perches notes reviewed.  U/S and HIDA scan reports reviewed. Assessment:Chronic cholecystitis  Diagnoses: 575.11  K81.1 Chronic cholecystitis without calculus (Chronic cholecystitis)  Procedures: [id number] - OFFICE OUTPATIENT NEW 30 MINUTES    Plan:  Scheduled for laparoscopic cholecystectomy on 08/02/16.   Patient Education:Alternative treatments to surgery were discussed with patient (and family).Risks and benefits  of procedure including bleeding, infection, hepatobiliary injury, and the possibility of an open procedure were fully explained to the patient (and family) who gave informed consent. Patient/family questions were addressed.  Follow-up:Pending Surgery

## 2016-08-02 ENCOUNTER — Encounter (HOSPITAL_COMMUNITY)
Admission: RE | Disposition: A | Discharge status: Home / Self Care | Payer: Self-pay | Source: Ambulatory Visit | Attending: General Surgery

## 2016-08-02 ENCOUNTER — Ambulatory Visit (HOSPITAL_COMMUNITY): Payer: BLUE CROSS/BLUE SHIELD | Admitting: Anesthesiology

## 2016-08-02 ENCOUNTER — Ambulatory Visit (HOSPITAL_COMMUNITY)
Admission: RE | Admit: 2016-08-02 | Discharge: 2016-08-02 | Disposition: A | Discharge status: Home / Self Care | Payer: BLUE CROSS/BLUE SHIELD | Source: Ambulatory Visit | Attending: General Surgery | Admitting: General Surgery

## 2016-08-02 DIAGNOSIS — F909 Attention-deficit hyperactivity disorder, unspecified type: Secondary | ICD-10-CM | POA: Insufficient documentation

## 2016-08-02 DIAGNOSIS — K811 Chronic cholecystitis: Secondary | ICD-10-CM | POA: Diagnosis present

## 2016-08-02 DIAGNOSIS — K219 Gastro-esophageal reflux disease without esophagitis: Secondary | ICD-10-CM | POA: Diagnosis not present

## 2016-08-02 DIAGNOSIS — R569 Unspecified convulsions: Secondary | ICD-10-CM | POA: Diagnosis not present

## 2016-08-02 HISTORY — PX: CHOLECYSTECTOMY: SHX55

## 2016-08-02 SURGERY — LAPAROSCOPIC CHOLECYSTECTOMY
Anesthesia: General | Site: Abdomen

## 2016-08-02 MED ORDER — MIDAZOLAM HCL 2 MG/2ML IJ SOLN
INTRAMUSCULAR | Status: AC
  Filled 2016-08-02: qty 2

## 2016-08-02 MED ORDER — PROPOFOL 10 MG/ML IV BOLUS
INTRAVENOUS | Status: AC
  Filled 2016-08-02: qty 40

## 2016-08-02 MED ORDER — GLYCOPYRROLATE 0.2 MG/ML IJ SOLN
INTRAMUSCULAR | Status: AC
  Filled 2016-08-02: qty 1

## 2016-08-02 MED ORDER — CHLORHEXIDINE GLUCONATE CLOTH 2 % EX PADS: 6.0000 | MEDICATED_PAD | Freq: Once | CUTANEOUS | Status: DC

## 2016-08-02 MED ORDER — BUPIVACAINE HCL (PF) 0.5 % IJ SOLN
INTRAMUSCULAR | Status: DC | PRN
Start: 2016-08-02 — End: 2016-08-02
  Administered 2016-08-02: 10 mL

## 2016-08-02 MED ORDER — PROPOFOL 10 MG/ML IV BOLUS
INTRAVENOUS | Status: DC | PRN
  Administered 2016-08-02: 20 mg via INTRAVENOUS
  Administered 2016-08-02: 180 mg via INTRAVENOUS

## 2016-08-02 MED ORDER — EPHEDRINE SULFATE 50 MG/ML IJ SOLN
INTRAMUSCULAR | Status: AC
  Filled 2016-08-02: qty 1

## 2016-08-02 MED ORDER — LIDOCAINE HCL (PF) 1 % IJ SOLN
INTRAMUSCULAR | Status: AC
  Filled 2016-08-02: qty 5

## 2016-08-02 MED ORDER — GLYCOPYRROLATE 0.2 MG/ML IJ SOLN
0.2000 mg | Freq: Once | INTRAMUSCULAR | Status: AC
  Administered 2016-08-02: 0.2 mg via INTRAVENOUS

## 2016-08-02 MED ORDER — FENTANYL CITRATE (PF) 100 MCG/2ML IJ SOLN
INTRAMUSCULAR | Status: DC | PRN
  Administered 2016-08-02 (×6): 50 ug via INTRAVENOUS
  Administered 2016-08-02 (×2): 25 ug via INTRAVENOUS

## 2016-08-02 MED ORDER — OXYCODONE-ACETAMINOPHEN 7.5-325 MG PO TABS: 1.0000 | ORAL_TABLET | ORAL | 0 refills | Status: DC | PRN

## 2016-08-02 MED ORDER — LIDOCAINE HCL (CARDIAC) 10 MG/ML IV SOLN
INTRAVENOUS | Status: DC | PRN
  Administered 2016-08-02: 50 mg via INTRAVENOUS

## 2016-08-02 MED ORDER — SEVOFLURANE IN SOLN
RESPIRATORY_TRACT | Status: AC
  Filled 2016-08-02: qty 250

## 2016-08-02 MED ORDER — ROCURONIUM BROMIDE 100 MG/10ML IV SOLN
INTRAVENOUS | Status: DC | PRN
  Administered 2016-08-02: 5 mg via INTRAVENOUS
  Administered 2016-08-02: 20 mg via INTRAVENOUS

## 2016-08-02 MED ORDER — LACTATED RINGERS IV SOLN
INTRAVENOUS | Status: DC
  Administered 2016-08-02 (×2): via INTRAVENOUS

## 2016-08-02 MED ORDER — LIDOCAINE HCL (PF) 0.5 % IJ SOLN
INTRAMUSCULAR | Status: AC
  Filled 2016-08-02: qty 50

## 2016-08-02 MED ORDER — ROCURONIUM BROMIDE 50 MG/5ML IV SOLN
INTRAVENOUS | Status: AC
  Filled 2016-08-02: qty 1

## 2016-08-02 MED ORDER — SUCCINYLCHOLINE CHLORIDE 20 MG/ML IJ SOLN
INTRAMUSCULAR | Status: DC | PRN
  Administered 2016-08-02: 160 mg via INTRAVENOUS

## 2016-08-02 MED ORDER — FENTANYL CITRATE (PF) 100 MCG/2ML IJ SOLN
INTRAMUSCULAR | Status: AC
  Filled 2016-08-02: qty 2

## 2016-08-02 MED ORDER — GLYCOPYRROLATE 0.2 MG/ML IJ SOLN
INTRAMUSCULAR | Status: DC | PRN
  Administered 2016-08-02: 0.6 mg via INTRAVENOUS

## 2016-08-02 MED ORDER — BUPIVACAINE HCL (PF) 0.5 % IJ SOLN
INTRAMUSCULAR | Status: AC
  Filled 2016-08-02: qty 30

## 2016-08-02 MED ORDER — ONDANSETRON HCL 4 MG/2ML IJ SOLN
4.0000 mg | Freq: Once | INTRAMUSCULAR | Status: AC
  Administered 2016-08-02: 4 mg via INTRAVENOUS

## 2016-08-02 MED ORDER — CIPROFLOXACIN IN D5W 400 MG/200ML IV SOLN
400.0000 mg | INTRAVENOUS | Status: AC
  Administered 2016-08-02: 400 mg via INTRAVENOUS
  Filled 2016-08-02: qty 200

## 2016-08-02 MED ORDER — ATROPINE SULFATE 0.4 MG/ML IJ SOLN
INTRAMUSCULAR | Status: AC
  Filled 2016-08-02: qty 1

## 2016-08-02 MED ORDER — MIDAZOLAM HCL 5 MG/5ML IJ SOLN
INTRAMUSCULAR | Status: DC | PRN
  Administered 2016-08-02: 2 mg via INTRAVENOUS

## 2016-08-02 MED ORDER — SODIUM CHLORIDE 0.9 % IR SOLN
Status: DC | PRN
Start: 2016-08-02 — End: 2016-08-02
  Administered 2016-08-02: 1000 mL

## 2016-08-02 MED ORDER — POVIDONE-IODINE 10 % EX OINT
TOPICAL_OINTMENT | CUTANEOUS | Status: AC
  Filled 2016-08-02: qty 1

## 2016-08-02 MED ORDER — KETOROLAC TROMETHAMINE 30 MG/ML IJ SOLN
30.0000 mg | Freq: Once | INTRAMUSCULAR | Status: AC
  Administered 2016-08-02: 30 mg via INTRAVENOUS

## 2016-08-02 MED ORDER — DEXAMETHASONE SODIUM PHOSPHATE 4 MG/ML IJ SOLN
INTRAMUSCULAR | Status: AC
  Filled 2016-08-02: qty 1

## 2016-08-02 MED ORDER — MIDAZOLAM HCL 2 MG/2ML IJ SOLN
1.0000 mg | INTRAMUSCULAR | Status: DC | PRN
  Administered 2016-08-02: 2 mg via INTRAVENOUS

## 2016-08-02 MED ORDER — NEOSTIGMINE METHYLSULFATE 10 MG/10ML IV SOLN
INTRAVENOUS | Status: DC | PRN
  Administered 2016-08-02: 3 mg via INTRAVENOUS

## 2016-08-02 MED ORDER — DEXAMETHASONE SODIUM PHOSPHATE 4 MG/ML IJ SOLN
4.0000 mg | Freq: Once | INTRAMUSCULAR | Status: AC
  Administered 2016-08-02: 4 mg via INTRAVENOUS

## 2016-08-02 MED ORDER — FENTANYL CITRATE (PF) 250 MCG/5ML IJ SOLN
INTRAMUSCULAR | Status: AC
  Filled 2016-08-02: qty 5

## 2016-08-02 MED ORDER — NEOSTIGMINE METHYLSULFATE 10 MG/10ML IV SOLN
INTRAVENOUS | Status: AC
  Filled 2016-08-02: qty 1

## 2016-08-02 MED ORDER — GLYCOPYRROLATE 0.2 MG/ML IJ SOLN
INTRAMUSCULAR | Status: AC
  Filled 2016-08-02: qty 3

## 2016-08-02 MED ORDER — KETOROLAC TROMETHAMINE 30 MG/ML IJ SOLN
INTRAMUSCULAR | Status: AC
  Filled 2016-08-02: qty 1

## 2016-08-02 MED ORDER — ONDANSETRON HCL 4 MG/2ML IJ SOLN
INTRAMUSCULAR | Status: AC
  Filled 2016-08-02: qty 2

## 2016-08-02 MED ORDER — SUCCINYLCHOLINE CHLORIDE 20 MG/ML IJ SOLN
INTRAMUSCULAR | Status: AC
  Filled 2016-08-02: qty 1

## 2016-08-02 MED ORDER — HEMOSTATIC AGENTS (NO CHARGE) OPTIME
TOPICAL | Status: DC | PRN
  Administered 2016-08-02: 1 via TOPICAL

## 2016-08-02 MED ORDER — HYDROMORPHONE HCL 1 MG/ML IJ SOLN
0.2500 mg | INTRAMUSCULAR | Status: DC | PRN
  Administered 2016-08-02 (×4): 0.5 mg via INTRAVENOUS
  Filled 2016-08-02 (×4): qty 0.5

## 2016-08-02 SURGICAL SUPPLY — 43 items
APPLIER CLIP LAPSCP 10X32 DD (CLIP) ×3 IMPLANT
BAG HAMPER (MISCELLANEOUS) ×3 IMPLANT
BAG SPEC RTRVL LRG 6X4 10 (ENDOMECHANICALS) ×1
CHLORAPREP W/TINT 26ML (MISCELLANEOUS) ×3 IMPLANT
CLOTH BEACON ORANGE TIMEOUT ST (SAFETY) ×3 IMPLANT
COVER LIGHT HANDLE STERIS (MISCELLANEOUS) ×6 IMPLANT
DECANTER SPIKE VIAL GLASS SM (MISCELLANEOUS) ×3 IMPLANT
ELECT REM PT RETURN 9FT ADLT (ELECTROSURGICAL) ×3
ELECTRODE REM PT RTRN 9FT ADLT (ELECTROSURGICAL) ×1 IMPLANT
FILTER SMOKE EVAC LAPAROSHD (FILTER) ×3 IMPLANT
FORMALIN 10 PREFIL 120ML (MISCELLANEOUS) ×3 IMPLANT
GLOVE BIOGEL PI IND STRL 7.0 (GLOVE) ×1 IMPLANT
GLOVE BIOGEL PI INDICATOR 7.0 (GLOVE) ×2
GLOVE SURG SS PI 7.5 STRL IVOR (GLOVE) ×3 IMPLANT
GOWN STRL REUS W/ TWL XL LVL3 (GOWN DISPOSABLE) ×1 IMPLANT
GOWN STRL REUS W/TWL LRG LVL3 (GOWN DISPOSABLE) ×6 IMPLANT
GOWN STRL REUS W/TWL XL LVL3 (GOWN DISPOSABLE) ×3
HEMOSTAT SNOW SURGICEL 2X4 (HEMOSTASIS) ×3 IMPLANT
INST SET LAPROSCOPIC AP (KITS) ×3 IMPLANT
IV NS IRRIG 3000ML ARTHROMATIC (IV SOLUTION) IMPLANT
KIT ROOM TURNOVER APOR (KITS) ×3 IMPLANT
MANIFOLD NEPTUNE II (INSTRUMENTS) ×3 IMPLANT
NDL INSUFFLATION 14GA 120MM (NEEDLE) ×1 IMPLANT
NEEDLE INSUFFLATION 14GA 120MM (NEEDLE) ×3 IMPLANT
NS IRRIG 1000ML POUR BTL (IV SOLUTION) ×3 IMPLANT
PACK LAP CHOLE LZT030E (CUSTOM PROCEDURE TRAY) ×3 IMPLANT
PAD ARMBOARD 7.5X6 YLW CONV (MISCELLANEOUS) ×3 IMPLANT
POUCH SPECIMEN RETRIEVAL 10MM (ENDOMECHANICALS) ×3 IMPLANT
SET BASIN LINEN APH (SET/KITS/TRAYS/PACK) ×3 IMPLANT
SET TUBE IRRIG SUCTION NO TIP (IRRIGATION / IRRIGATOR) IMPLANT
SLEEVE ENDOPATH XCEL 5M (ENDOMECHANICALS) ×3 IMPLANT
SPONGE GAUZE 2X2 8PLY STER LF (GAUZE/BANDAGES/DRESSINGS) ×1
SPONGE GAUZE 2X2 8PLY STRL LF (GAUZE/BANDAGES/DRESSINGS) ×5 IMPLANT
STAPLER VISISTAT (STAPLE) ×3 IMPLANT
SUT VICRYL 0 UR6 27IN ABS (SUTURE) ×3 IMPLANT
TAPE CLOTH SURG 4X10 WHT LF (GAUZE/BANDAGES/DRESSINGS) ×2 IMPLANT
TROCAR ENDO BLADELESS 11MM (ENDOMECHANICALS) ×3 IMPLANT
TROCAR XCEL NON-BLD 5MMX100MML (ENDOMECHANICALS) ×3 IMPLANT
TROCAR XCEL UNIV SLVE 11M 100M (ENDOMECHANICALS) ×3 IMPLANT
TUBE CONNECTING 12'X1/4 (SUCTIONS) ×1
TUBE CONNECTING 12X1/4 (SUCTIONS) ×2 IMPLANT
TUBING INSUFFLATION (TUBING) ×3 IMPLANT
WARMER LAPAROSCOPE (MISCELLANEOUS) ×3 IMPLANT

## 2016-08-02 NOTE — Anesthesia Preprocedure Evaluation (Signed)
Anesthesia Evaluation  Patient identified by MRN, date of birth, ID band Patient awake    Reviewed: Allergy & Precautions, H&P , NPO status , Patient's Chart, lab work & pertinent test results  History of Anesthesia Complications Negative for: history of anesthetic complications  Airway Mallampati: III  TM Distance: >3 FB Neck ROM: Full  Mouth opening: Limited Mouth Opening  Dental no notable dental hx. (+) Dental Advisory Given, Teeth Intact   Pulmonary neg pulmonary ROS,    Pulmonary exam normal breath sounds clear to auscultation       Cardiovascular Exercise Tolerance: Good negative cardio ROS Normal cardiovascular exam Rhythm:Regular Rate:Normal  Vasovagal syncope   Neuro/Psych Seizures -,  PSYCHIATRIC DISORDERS (ADHD) 2006 and 2015 seizures not explained negative neurological ROS     GI/Hepatic Neg liver ROS, GERD  ,  Endo/Other  negative endocrine ROS  Renal/GU Renal disease  negative genitourinary   Musculoskeletal negative musculoskeletal ROS (+)   Abdominal   Peds negative pediatric ROS (+)  Hematology negative hematology ROS (+)   Anesthesia Other Findings   Reproductive/Obstetrics negative OB ROS                             Anesthesia Physical Anesthesia Plan  ASA: II  Anesthesia Plan: General   Post-op Pain Management:    Induction: Intravenous, Rapid sequence and Cricoid pressure planned  Airway Management Planned: Video Laryngoscope Planned  Additional Equipment:   Intra-op Plan:   Post-operative Plan: Extubation in OR  Informed Consent: I have reviewed the patients History and Physical, chart, labs and discussed the procedure including the risks, benefits and alternatives for the proposed anesthesia with the patient or authorized representative who has indicated his/her understanding and acceptance.     Plan Discussed with:   Anesthesia Plan Comments:          Anesthesia Quick Evaluation

## 2016-08-02 NOTE — Interval H&P Note (Signed)
History and Physical Interval Note:  08/02/2016 7:18 AM  Chris Murphy  has presented today for surgery, with the diagnosis of chronic cholelithiasis  The various methods of treatment have been discussed with the patient and family. After consideration of risks, benefits and other options for treatment, the patient has consented to  Procedure(s): LAPAROSCOPIC CHOLECYSTECTOMY (N/A) as a surgical intervention .  The patient's history has been reviewed, patient examined, no change in status, stable for surgery.  I have reviewed the patient's chart and labs.  Questions were answered to the patient's satisfaction.     Aviva Signs A

## 2016-08-02 NOTE — Discharge Instructions (Signed)
Laparoscopic Cholecystectomy, Care After °Refer to this sheet in the next few weeks. These instructions provide you with information about caring for yourself after your procedure. Your health care provider may also give you more specific instructions. Your treatment has been planned according to current medical practices, but problems sometimes occur. Call your health care provider if you have any problems or questions after your procedure. °WHAT TO EXPECT AFTER THE PROCEDURE °After your procedure, it is common to have: °· Pain at your incision sites. You will be given pain medicines to control your pain. °· Mild nausea or vomiting. This should improve after the first 24 hours. °· Bloating and possible shoulder pain from the gas that was used during the procedure. This will improve after the first 24 hours. °HOME CARE INSTRUCTIONS °Incision Care °· Follow instructions from your health care provider about how to take care of your incisions. Make sure you: °¨ Wash your hands with soap and water before you change your bandage (dressing). If soap and water are not available, use hand sanitizer. °¨ Change your dressing as told by your health care provider. °¨ Leave stitches (sutures), skin glue, or adhesive strips in place. These skin closures may need to be in place for 2 weeks or longer. If adhesive strip edges start to loosen and curl up, you may trim the loose edges. Do not remove adhesive strips completely unless your health care provider tells you to do that. °· Do not take baths, swim, or use a hot tub until your health care provider approves. Ask your health care provider if you can take showers. You may only be allowed to take sponge baths for bathing. °General Instructions °· Take over-the-counter and prescription medicines only as told by your health care provider. °· Do not drive or operate heavy machinery while taking prescription pain medicine. °· Return to your normal diet as told by your health care  provider. °· Do not lift anything that is heavier than 10 lb (4.5 kg). °· Do not play contact sports for one week or until your health care provider approves. °SEEK MEDICAL CARE IF:  °· You have redness, swelling, or pain at the site of your incision. °· You have fluid, blood, or pus coming from your incision. °· You notice a bad smell coming from your incision area. °· Your surgical incisions break open. °· You have a fever. °SEEK IMMEDIATE MEDICAL CARE IF: °· You develop a rash. °· You have difficulty breathing. °· You have chest pain. °· You have increasing pain in your shoulders (shoulder strap areas). °· You faint or have dizzy episodes while you are standing. °· You have severe pain in your abdomen. °· You have nausea or vomiting that lasts for more than one day. °  °This information is not intended to replace advice given to you by your health care provider. Make sure you discuss any questions you have with your health care provider. °  °Document Released: 09/27/2005 Document Revised: 06/18/2015 Document Reviewed: 05/09/2013 °Elsevier Interactive Patient Education ©2016 Elsevier Inc. ° °

## 2016-08-02 NOTE — Op Note (Signed)
Patient:  Chris Murphy  DOB:  05-Oct-1991  MRN:  [id number]   Preop Diagnosis:  Chronic cholecystitis  Postop Diagnosis:  Same  Procedure:  Laparoscopic cholecystectomy  Surgeon:  Aviva Signs, M.D.  Assistant: Tama High, M.D.  Anes:  Gen. endotracheal  Indications:  Patient is a 25 year old white male who presents with biliary colic secondary to chronic cholecystitis. The risks and benefits of the procedure including bleeding, infection, hepatobiliary injury, and the possibility of an open procedure were fully explained to the patient, who gave informed consent.  Procedure note:  The patient was placed the supine position. After induction of general endotracheal anesthesia, the abdomen was prepped and draped using the usual sterile technique with DuraPrep. Surgical site confirmation was performed.  A supraumbilical incision was made down to the fascia. A Veress needle was introduced into the abdominal cavity and confirmation of placement was done using the saline drop test. The abdomen was then insufflated to 16 mmHg pressure. An 11 mm trocar was introduced into the abdominal cavity under direct visualization without difficulty. The patient was placed in reverse Trendelenburg position and an additional 11 mm trocar was placed the epigastric region and 5 mm trochars were placed the right upper quadrant and right flank regions. Liver was inspected and noted to be within normal limits. The gallbladder was retracted in a dynamic fashion in order to provide a critical view of the triangle of Calot. The cystic duct was first identified. Its juncture to the infundibulum was fully identified. Endoclips placed proximally and distally on the cystic duct, and the cystic duct was divided. This was likewise done to the cystic artery. The gallbladder was freed away from the gallbladder fossa using Bovie electrocautery. The gallbladder was delivered through the epigastric trocar site using an Endo Catch  bag. The gallbladder fossa was inspected and no abnormal bleeding or bile leakage was noted. Surgicel was placed the gallbladder fossa. All fluid and air were then evacuated from the abdominal cavity prior to removal of the trochars.  All wounds were irrigated with normal saline. All wounds were injected with 0.5% Sensorcaine. The supraumbilical fascia was reapproximated using an 0 Vicryl interrupted suture. All skin incisions were closed using staples. Betadine ointment and dry sterile dressings were applied.  All tape and needle counts were correct at the end of the procedure. Patient was extubated in the operating room and transferred to PACU in stable condition.  Complications:  None  EBL:  Minimal  Specimen:  Gallbladder

## 2016-08-02 NOTE — Transfer of Care (Signed)
Immediate Anesthesia Transfer of Care Note  Patient: Chris Murphy  Procedure(s) Performed: Procedure(s): LAPAROSCOPIC CHOLECYSTECTOMY (N/A)  Patient Location: PACU  Anesthesia Type:General  Level of Consciousness: awake  Airway & Oxygen Therapy: Patient Spontanous Breathing and non-rebreather face mask  Post-op Assessment: Report given to RN and Post -op Vital signs reviewed and stable  Post vital signs: Reviewed and stable  Last Vitals:  Vitals:   08/02/16 0730 08/02/16 0735  BP: 120/75 120/74  Pulse:    Resp: 16 15  Temp:      Last Pain:  Vitals:   08/02/16 0642  TempSrc: Oral  PainSc: 1       Patients Stated Pain Goal: 5 ([id number])  Complications: No apparent anesthesia complications

## 2016-08-02 NOTE — Anesthesia Procedure Notes (Signed)
Procedure Name: Intubation Date/Time: 08/02/2016 7:49 AM Performed by: Vista Deck Pre-anesthesia Checklist: Patient identified, Emergency Drugs available, Suction available, Patient being monitored and Timeout performed Patient Re-evaluated:Patient Re-evaluated prior to inductionOxygen Delivery Method: Circle system utilized Preoxygenation: Pre-oxygenation with 100% oxygen Intubation Type: IV induction, Rapid sequence and Cricoid Pressure applied Laryngoscope Size: Glidescope and 3 (LOPRO) Grade View: Grade I Tube type: Oral Tube size: 7.0 mm Number of attempts: 1 Airway Equipment and Method: Video-laryngoscopy,  Stylet and Oral airway Placement Confirmation: ETT inserted through vocal cords under direct vision,  positive ETCO2 and breath sounds checked- equal and bilateral Secured at: 22 cm Tube secured with: Tape Dental Injury: Teeth and Oropharynx as per pre-operative assessment

## 2016-08-02 NOTE — Anesthesia Postprocedure Evaluation (Signed)
Anesthesia Post Note  Patient: Chris Murphy  Procedure(s) Performed: Procedure(s) (LRB): LAPAROSCOPIC CHOLECYSTECTOMY (N/A)  Patient location during evaluation: PACU Anesthesia Type: General Level of consciousness: awake Pain management: satisfactory to patient Vital Signs Assessment: post-procedure vital signs reviewed and stable Respiratory status: spontaneous breathing and non-rebreather facemask Cardiovascular status: blood pressure returned to baseline Anesthetic complications: no    Last Vitals:  Vitals:   08/02/16 0735 08/02/16 0845  BP: 120/74 (!) 192/122  Pulse:  (!) 39  Resp: 15 12  Temp:  36.7 C    Last Pain:  Vitals:   08/02/16 0845  TempSrc:   PainSc: (P) 10-Worst pain ever                 Monty Spicher

## 2016-08-06 ENCOUNTER — Encounter (HOSPITAL_COMMUNITY): Payer: Self-pay | Admitting: General Surgery

## 2016-08-19 ENCOUNTER — Ambulatory Visit (INDEPENDENT_AMBULATORY_CARE_PROVIDER_SITE_OTHER): Payer: BLUE CROSS/BLUE SHIELD | Admitting: Family Medicine

## 2016-08-19 ENCOUNTER — Encounter: Payer: Self-pay | Admitting: Family Medicine

## 2016-08-19 VITALS — BP 112/76 | Ht 72.0 in | Wt 250.2 lb

## 2016-08-19 DIAGNOSIS — F902 Attention-deficit hyperactivity disorder, combined type: Secondary | ICD-10-CM

## 2016-08-19 MED ORDER — METHYLPHENIDATE HCL ER (OSM) 54 MG PO TBCR: 54.0000 mg | EXTENDED_RELEASE_TABLET | ORAL | 0 refills | Status: DC

## 2016-08-19 MED ORDER — METHYLPHENIDATE HCL ER (OSM) 54 MG PO TBCR
54.0000 mg | EXTENDED_RELEASE_TABLET | ORAL | 0 refills | Status: DC
Start: 2016-08-19 — End: 2016-12-23

## 2016-08-19 MED ORDER — METHYLPHENIDATE HCL ER (OSM) 54 MG PO TBCR
54.0000 mg | EXTENDED_RELEASE_TABLET | ORAL | 0 refills | Status: DC
Start: 2016-08-19 — End: 2016-08-19

## 2016-08-19 NOTE — Progress Notes (Signed)
   Subjective:    Patient ID: Chris Murphy, male    DOB: 01-03-1991, 25 y.o.   MRN: [id number]  HPI Patient was seen today for ADD checkup. -weight, vital signs reviewed.  The following items were covered. -Compliance with medication : Takes daily   -Problems with completing homework, paying attention/taking good notes in school: Patient states no difficulty concentrating   -grades: N/A  - Eating patterns : States eating habits are good.   -sleeping: States sleeps wellnStill getting benerfit form the meds    -Additional issues or questions: States no additional concerns this visit.   Review of Systems No headache, no major weight loss or weight gain, no chest pain no back pain abdominal pain no change in bowel habits complete ROS otherwise negative     Objective:   Physical Exam Alert vitals stable, NAD. Blood pressure good on repeat. HEENT normal. Lungs clear. Heart regular rate and rhythm.        Assessment & Plan:  Impression ADHD good control handling medications well. No obvious side effects. Plan medications refilled. Diet exercise discussed. Follow-up in 4 months

## 2016-08-25 ENCOUNTER — Ambulatory Visit (INDEPENDENT_AMBULATORY_CARE_PROVIDER_SITE_OTHER): Payer: BLUE CROSS/BLUE SHIELD | Admitting: Internal Medicine

## 2016-09-13 ENCOUNTER — Encounter (INDEPENDENT_AMBULATORY_CARE_PROVIDER_SITE_OTHER): Payer: Self-pay | Admitting: Internal Medicine

## 2016-09-13 ENCOUNTER — Ambulatory Visit (INDEPENDENT_AMBULATORY_CARE_PROVIDER_SITE_OTHER): Payer: BLUE CROSS/BLUE SHIELD | Admitting: Internal Medicine

## 2016-09-13 ENCOUNTER — Encounter (INDEPENDENT_AMBULATORY_CARE_PROVIDER_SITE_OTHER): Payer: Self-pay

## 2016-12-23 ENCOUNTER — Encounter: Payer: Self-pay | Admitting: Family Medicine

## 2016-12-23 ENCOUNTER — Ambulatory Visit (INDEPENDENT_AMBULATORY_CARE_PROVIDER_SITE_OTHER): Payer: BLUE CROSS/BLUE SHIELD | Admitting: Family Medicine

## 2016-12-23 VITALS — BP 114/80 | Ht 72.0 in | Wt 267.5 lb

## 2016-12-23 DIAGNOSIS — F902 Attention-deficit hyperactivity disorder, combined type: Secondary | ICD-10-CM | POA: Diagnosis not present

## 2016-12-23 MED ORDER — METHYLPHENIDATE HCL ER (OSM) 54 MG PO TBCR: 54.0000 mg | EXTENDED_RELEASE_TABLET | ORAL | 0 refills | Status: DC

## 2016-12-23 MED ORDER — METHYLPHENIDATE HCL ER (OSM) 54 MG PO TBCR
54.0000 mg | EXTENDED_RELEASE_TABLET | ORAL | 0 refills | Status: DC
Start: 2016-12-23 — End: 2017-06-09

## 2016-12-23 MED ORDER — METHYLPHENIDATE HCL ER (OSM) 54 MG PO TBCR
54.0000 mg | EXTENDED_RELEASE_TABLET | ORAL | 0 refills | Status: DC
Start: 2016-12-23 — End: 2016-12-23

## 2016-12-23 NOTE — Progress Notes (Signed)
   Subjective:    Patient ID: Chris Murphy, male    DOB: 10-Apr-1991, 26 y.o.   MRN: [id number]  HPI  Patient was seen today for ADD checkup. -weight, vital signs reviewed.  The following items were covered. -Compliance with medication :   -Problems with completing homework, paying attention/taking good notes in school: Patient states some difficulty concentrating but nothing outside his norm.  -grades: N/A  - Eating patterns : Patient states that eating is good. States he may over eat.   -sleeping: States no difficulty sleeping.   -Additional issues or questions: States no concerns this visit. atient was seen today for ADD checkup. -weight, vital signs reviewed.  The following items were covered. -Compliance with medication :   -Problems with completing homework, paying attention/taking good notes in school: Patient states some difficulty concentrating but nothing outside his norm.  -grades: N/A  - Eating patterns : Patient states that eating is good. States he may over eat.   -sleeping: States no difficulty sleeping.   -Additional issues or questions: States no concerns this visit.  Weekly taking pepto bdimol   Tolerating medication well. No obvious side effects. Does not miss a dose. Definitely helps is focusing and attention   Weekly taking pepto bdimol    Review of Systems No headache, no major weight loss or weight gain, no chest pain no back pain abdominal pain no change in bowel habits complete ROS otherwise negative     Objective:   Physical Exam  Alert vitals stable, NAD. Blood pressure good on repeat. HEENT normal. Lungs clear. Heart regular rate and rhythm.       Assessment & Plan:  Impression ADHD good control discussed compliance discussed medications refilled and prescribed WSL

## 2017-04-19 ENCOUNTER — Telehealth: Payer: Self-pay | Admitting: *Deleted

## 2017-04-19 NOTE — Telephone Encounter (Signed)
Spoke with patient and informed him that prior auth was submited we are awaiting response. Patient verbalized understanding.

## 2017-04-19 NOTE — Telephone Encounter (Signed)
Patient called to check the status of his piror authorization for his medications. Please advise [phone]

## 2017-04-20 ENCOUNTER — Telehealth: Payer: Self-pay | Admitting: Family Medicine

## 2017-04-20 NOTE — Telephone Encounter (Signed)
Patient's Concerta 54 MG. Brand name only is approved from 04/20/17-04/20/2018. Patient and pharmacy notified.

## 2017-06-09 ENCOUNTER — Ambulatory Visit (INDEPENDENT_AMBULATORY_CARE_PROVIDER_SITE_OTHER): Payer: 59 | Admitting: Family Medicine

## 2017-06-09 ENCOUNTER — Encounter: Payer: Self-pay | Admitting: Family Medicine

## 2017-06-09 VITALS — BP 118/84 | Ht 72.0 in | Wt 279.0 lb

## 2017-06-09 DIAGNOSIS — F902 Attention-deficit hyperactivity disorder, combined type: Secondary | ICD-10-CM | POA: Diagnosis not present

## 2017-06-09 MED ORDER — METHYLPHENIDATE HCL ER (OSM) 54 MG PO TBCR: 54.0000 mg | EXTENDED_RELEASE_TABLET | ORAL | 0 refills | Status: DC

## 2017-06-09 MED ORDER — METHYLPHENIDATE HCL ER (OSM) 54 MG PO TBCR
54.0000 mg | EXTENDED_RELEASE_TABLET | ORAL | 0 refills | Status: DC
Start: 2017-06-09 — End: 2017-06-09

## 2017-06-09 NOTE — Progress Notes (Signed)
   Subjective:    Patient ID: Chris Murphy, male    DOB: 1991-06-15, 26 y.o.   MRN: [id number]  HPIpt arrives for ADD check up. Takes concerta 54mg  qam. No problems or concerns.   Work overall good, staying busy.  Family working hard  Reg exercise, not doing it   No sig side effects   Handling meds well   meds   No obvious side effects with the medication Review of Systems No headache, no major weight loss or weight gain, no chest pain no back pain abdominal pain no change in bowel habits complete ROS otherwise negative     Objective:   Physical Exam Alert vitals stable, NAD. Blood pressure good on repeat. HEENT normal. Lungs clear. Heart regular rate and rhythm.        Assessment & Plan:  Impression ADHD good control discussed maintain same meds exercise encourage her both improved physical and mental health. 4 months worth written

## 2017-09-17 IMAGING — NM NM HEPATOBILIARY IMAGE, INC GB
2 series · 12 of 12 positions shown · non-contrast
Comparison: None; correlation CT abdomen and pelvis 08/21/2014

CLINICAL DATA: Non intractable nausea and vomiting for 1 year, mid
abdominal pain, history GERD

EXAM:
NUCLEAR MEDICINE HEPATOBILIARY IMAGING
TECHNIQUE: Sequential images of the abdomen were obtained [DATE] minutes
following intravenous administration of radiopharmaceutical.
RADIOPHARMACEUTICALS:  5 mCi Cc-EEm  Choletec IV

[Series 1: biliary · 3.25mm/px · 6 of 60 frames shown]
[frame 6/60]
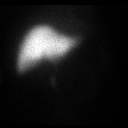
[frame 16/60]
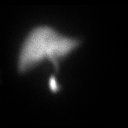
[frame 26/60]
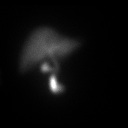
[frame 36/60]
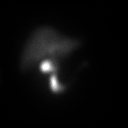
[frame 46/60]
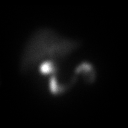
[frame 56/60]
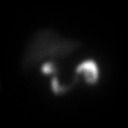

[Series 2: gbef · 3.25mm/px · 6 of 60 frames shown]
[frame 6/60]
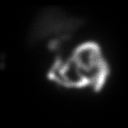
[frame 16/60]
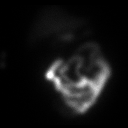
[frame 26/60]
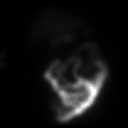
[frame 36/60]
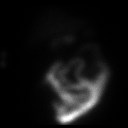
[frame 46/60]
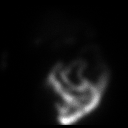
[frame 56/60]
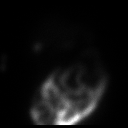

[12 of 12 positions shown; findings below may reference images not displayed]

FINDINGS: Normal tracer extraction oil name indicating normal hepatocellular
function.

Prompt excretion of tracer into biliary tree.

Duodenum visualized by 11 minutes.

A collection of tracer is seen adjacent to the CBD near the porta
hepatis, initially felt to represent the gallbladder.

However, correlation with the prior CT images shows this is most
likely the duodenal bulb and is not located far RIGHT lateral to
represent the gallbladder position.

Additional imaging was performed after Ensure but unable to
calculate a gallbladder ejection fraction due to lack of
visualization of the gallbladder prior to Ensure.

Late in the second hour of imaging, a subtle focus of tracer
accumulation is seen at the lateral aspect of the inferior margin of
liver which represents the gallbladder.

Findings represent delayed gallbladder visualization with patency of
the cystic duct, but raising the question of chronic cholecystitis.
IMPRESSION: Normal hepatocellular function.

Patent CBD.

Delayed visualization of the gallbladder consistent with a patent
cystic duct but raising the question of chronic cholecystitis.

## 2017-11-04 ENCOUNTER — Encounter: Payer: Self-pay | Admitting: Family Medicine

## 2017-11-04 ENCOUNTER — Ambulatory Visit: Payer: 59 | Admitting: Family Medicine

## 2017-11-04 VITALS — BP 114/74 | Ht 72.0 in | Wt 284.0 lb

## 2017-11-04 DIAGNOSIS — F902 Attention-deficit hyperactivity disorder, combined type: Secondary | ICD-10-CM | POA: Diagnosis not present

## 2017-11-04 MED ORDER — METHYLPHENIDATE HCL ER (OSM) 54 MG PO TBCR: 54.0000 mg | EXTENDED_RELEASE_TABLET | ORAL | 0 refills | Status: DC

## 2017-11-04 NOTE — Progress Notes (Signed)
   Subjective:    Patient ID: Chris Murphy, male    DOB: [birth year], 27 y.o.   MRN: [id number]  HPIADD check up. Feels sleepy for about 2 hours after taking medicine.   Going to [employer] twice per week   Notes drowsiness in the morn worse couple hours after ataking meds  Overall meds effective up until about four pm, after that , has challenges with focusing    Gets enough sleep at [place] time ; Review of Systems No headache, no major weight loss or weight gain, no chest pain no back pain abdominal pain no change in bowel habits complete ROS otherwise negative     Objective:   Physical Exam  Alert vitals stable, NAD. Blood pressure good on repeat. HEENT normal. Lungs clear. Heart regular rate and rhythm.       Assessment & Plan:  Impression ADHD overall good control.  Patient education during the week.  No obvious side effects.  4 months worth prescribed follow-up and exercise encouraged

## 2017-11-08 ENCOUNTER — Telehealth: Payer: Self-pay | Admitting: Family Medicine

## 2017-11-08 NOTE — Telephone Encounter (Signed)
Rx prior auth APPROVED for pt's methylphenidate 54 MG PO CR tablet (Concerta)  [id number], valid until 11/08/2018 through [employer]  Faxed approval to [facility], sent to be scanned, & filed

## 2018-03-02 ENCOUNTER — Ambulatory Visit: Payer: 59 | Admitting: Family Medicine

## 2018-03-02 ENCOUNTER — Encounter: Payer: Self-pay | Admitting: Family Medicine

## 2018-03-02 VITALS — Temp 99.0°F | Ht 72.0 in | Wt 293.2 lb

## 2018-03-02 DIAGNOSIS — B349 Viral infection, unspecified: Secondary | ICD-10-CM

## 2018-03-02 DIAGNOSIS — J029 Acute pharyngitis, unspecified: Secondary | ICD-10-CM

## 2018-03-02 LAB — POCT RAPID STREP A (OFFICE): RAPID STREP A SCREEN: NEGATIVE

## 2018-03-02 NOTE — Progress Notes (Signed)
   Subjective:    Patient ID: Chris Murphy, male    DOB: 01/03/1991, 27 y.o.   MRN: [id number]  Sore Throat   This is a new problem. The current episode started yesterday. Associated symptoms include coughing and diarrhea. He has tried acetaminophen (nyquil) for the symptoms.   Results for orders placed or performed in visit on 03/02/18  POCT rapid strep A  Result Value Ref Range   Rapid Strep A Screen Negative Negative   Results for orders placed or performed in visit on 03/02/18  POCT rapid strep A  Result Value Ref Range   Rapid Strep A Screen Negative Negative    No obv fever  Sig coughing   Pos pain in throat  Energy level dimished  Diarrhea sig for the past towevlers    No sickness in fanily  Review of Systems  Respiratory: Positive for cough.   Gastrointestinal: Positive for diarrhea.       Objective:   Physical Exam  Alert, mild malaise. Hydration good Vitals stable.  Some nasal throat mild erythema neck supple intermittent cough lungs clear heart regular rate and rhythm abdomen hyperactive bowel sounds  Impression viral syndrome discussed symptom care discussed warning signs discussed.  Add Imodium as needed.  If sinus symptoms emerge or the next week call back for antibiotics      Assessment & Plan:

## 2018-03-03 LAB — STREP A DNA PROBE: STREP GP A DIRECT, DNA PROBE: NEGATIVE

## 2018-08-08 ENCOUNTER — Telehealth: Payer: Self-pay | Admitting: Family Medicine

## 2018-08-08 NOTE — Telephone Encounter (Signed)
Med refill: methylphenidate 54 MG PO CR tablet   Pharmacy: [facility], [place], [place]  nxt appt: med check w/ Ria Comment 08/21/18

## 2018-08-09 MED ORDER — METHYLPHENIDATE HCL ER (OSM) 54 MG PO TBCR: 54.0000 mg | EXTENDED_RELEASE_TABLET | ORAL | 0 refills | Status: DC

## 2018-08-09 NOTE — Telephone Encounter (Signed)
Patient notified

## 2018-08-09 NOTE — Telephone Encounter (Signed)
Prescription up front for pick up. Left message to return call to notify patient.

## 2018-08-09 NOTE — Telephone Encounter (Signed)
Ok times one 

## 2018-08-21 ENCOUNTER — Ambulatory Visit: Payer: 59 | Admitting: Family Medicine

## 2018-09-13 ENCOUNTER — Encounter: Payer: Self-pay | Admitting: Family Medicine

## 2018-09-13 NOTE — Telephone Encounter (Signed)
FYI, patient no showed med check visit with Ria Comment on 08/21/18 after getting meds refill.

## 2018-09-22 ENCOUNTER — Ambulatory Visit: Payer: 59 | Admitting: Family Medicine

## 2018-09-26 ENCOUNTER — Ambulatory Visit: Payer: 59 | Admitting: Family Medicine

## 2018-09-26 ENCOUNTER — Encounter: Payer: Self-pay | Admitting: Family Medicine

## 2018-09-26 VITALS — BP 116/90 | Ht 72.0 in | Wt 291.0 lb

## 2018-09-26 DIAGNOSIS — F900 Attention-deficit hyperactivity disorder, predominantly inattentive type: Secondary | ICD-10-CM | POA: Diagnosis not present

## 2018-09-26 MED ORDER — METHYLPHENIDATE HCL ER (OSM) 54 MG PO TBCR: 54.0000 mg | EXTENDED_RELEASE_TABLET | ORAL | 0 refills | Status: DC

## 2018-09-26 MED ORDER — METHYLPHENIDATE HCL ER (OSM) 54 MG PO TBCR
54.0000 mg | EXTENDED_RELEASE_TABLET | ORAL | 0 refills | Status: DC
Start: 2018-09-26 — End: 2019-04-17

## 2018-09-26 NOTE — Progress Notes (Signed)
   Subjective:    Patient ID: Chris Murphy, male    DOB: 31-Jan-1991, 27 y.o.   MRN: [id number]  HPI  Patient was seen today for ADD checkup.  This patient does have ADD.  Patient takes medications for this.  If this does help control overall symptoms.  Please see below. -weight, vital signs reviewed.  The following items were covered. -Compliance with medication : Yes, takes 1 tablet in the morning, helps him concentrate at work and complete work related tasks. Does not typically take on weekends unless he is working on something. Denies any adverse effects. No change in sleep patterns or appetite. Reports he is working on [employer] and exercise.  -Problems with completing homework, paying attention/taking good notes in school: N/A  -grades: N/A  - Eating patterns : Good  -sleeping: Good  -Additional issues or questions: None   Review of Systems  Constitutional: Negative for fatigue.  Respiratory: Negative for shortness of breath.   Cardiovascular: Negative for chest pain.  Psychiatric/Behavioral: Negative for behavioral problems. The patient is not hyperactive.        Objective:   Physical Exam Vitals signs and nursing note reviewed.  Constitutional:      General: He is not in acute distress.    Appearance: He is well-developed.  HENT:     Head: Normocephalic and atraumatic.  Neck:     Musculoskeletal: Neck supple.  Cardiovascular:     Rate and Rhythm: Normal rate and regular rhythm.     Heart sounds: Normal heart sounds. No murmur.  Pulmonary:     Effort: Pulmonary effort is normal. No respiratory distress.     Breath sounds: Normal breath sounds.  Skin:    General: Skin is warm and dry.  Neurological:     Mental Status: He is alert and oriented to person, place, and time.       Assessment & Plan:  Attention deficit hyperactivity disorder (ADHD), predominantly inattentive type  The patient was seen today as part of the visit regarding ADD. Medications  were reviewed with the patient as well as compliance. Side effects were checked for. Discussion regarding effectiveness was held. Prescriptions were written. Patient reminded to follow-up in approximately 3 months.   Complied with [facility] law, drug registry was checked and verified while present with the patient.  F/u in 3 months with Dr. Richardson Landry.

## 2019-04-03 ENCOUNTER — Telehealth: Payer: Self-pay | Admitting: Family Medicine

## 2019-04-03 NOTE — Telephone Encounter (Signed)
Patient is requesting refill on concerta 54 mg he was last seen 09/26/18 has appointment on 04/17/19 for med check needs enough medication until seen called into [facility]

## 2019-04-17 ENCOUNTER — Other Ambulatory Visit: Payer: Self-pay

## 2019-04-17 ENCOUNTER — Ambulatory Visit (INDEPENDENT_AMBULATORY_CARE_PROVIDER_SITE_OTHER): Payer: Self-pay | Admitting: Family Medicine

## 2019-04-17 DIAGNOSIS — F900 Attention-deficit hyperactivity disorder, predominantly inattentive type: Secondary | ICD-10-CM

## 2019-04-17 MED ORDER — METHYLPHENIDATE HCL ER (OSM) 54 MG PO TBCR: 54.0000 mg | EXTENDED_RELEASE_TABLET | ORAL | 0 refills | Status: DC

## 2019-04-17 NOTE — Progress Notes (Signed)
   Subjective:  Audio plus video  Patient ID: Chris Murphy, male    DOB: Jun 13, 1991, 28 y.o.   MRN: [id number]  HPI  Patient calls for a follow up on ADHD medication . Patient states he is doing very well on current medication with no problems or concerns. Patient is eating and sleeping well.  Virtual Visit via Video Note  I connected with Patty Sermons on 04/17/19 at  1:10 PM EDT by a video enabled telemedicine application and verified that I am speaking with the correct person using two identifiers.  Location: Patient: home Provider: office   I discussed the limitations of evaluation and management by telemedicine and the availability of in person appointments. The patient expressed understanding and agreed to proceed.  History of Present Illness:    Observations/Objective:   Assessment and Plan:   Follow Up Instructions:    I discussed the assessment and treatment plan with the patient. The patient was provided an opportunity to ask questions and all were answered. The patient agreed with the plan and demonstrated an understanding of the instructions.   The patient was advised to call back or seek an in-person evaluation if the symptoms worsen or if the condition fails to improve as anticipated.  I provided 30minutes of non-face-to-face time during this encounter. Patient states ADHD medication definitely working.  Non-side effects.  Needs refill.  Compliant with medications.  Definitely helps focusing    Review of Systems No headache, no major weight loss or weight gain, no chest pain no back pain abdominal pain no change in bowel habits complete ROS otherwise negative     Objective:   Physical Exam  Virtual      Assessment & Plan:  Impression ADHD good control discussed to maintain same meds diet exercise discussed

## 2019-04-21 ENCOUNTER — Encounter: Payer: Self-pay | Admitting: Family Medicine

## 2019-05-22 NOTE — Telephone Encounter (Signed)
Patient calling checking on refill

## 2019-05-23 NOTE — Telephone Encounter (Signed)
Pt has scripts on file at [place]. Contacted patient to make him aware; pt verbalized understanding

## 2019-10-24 ENCOUNTER — Ambulatory Visit: Payer: BC Managed Care – PPO | Admitting: Family Medicine

## 2019-10-24 ENCOUNTER — Other Ambulatory Visit: Payer: Self-pay

## 2019-10-31 ENCOUNTER — Ambulatory Visit (INDEPENDENT_AMBULATORY_CARE_PROVIDER_SITE_OTHER): Payer: BC Managed Care – PPO | Admitting: Family Medicine

## 2019-10-31 ENCOUNTER — Other Ambulatory Visit: Payer: Self-pay

## 2019-10-31 DIAGNOSIS — F900 Attention-deficit hyperactivity disorder, predominantly inattentive type: Secondary | ICD-10-CM | POA: Diagnosis not present

## 2019-10-31 MED ORDER — METHYLPHENIDATE HCL ER (OSM) 54 MG PO TBCR: 54.0000 mg | EXTENDED_RELEASE_TABLET | ORAL | 0 refills | Status: DC

## 2019-10-31 NOTE — Progress Notes (Signed)
   Subjective:  Audio only  Patient ID: Chris Murphy, male    DOB: 07-28-1991, 29 y.o.   MRN: [id number]  HPI Patient was seen today for ADD checkup.  This patient does have ADD.  Patient takes medications for this.  If this does help control overall symptoms.  Please see below. -weight, vital signs reviewed.  The following items were covered. -Compliance with medication : Concerta 54 mg  -Problems with completing homework, paying attention/taking good notes in school: pt not in school at this time/no classes at this time  -grades: n/a  - Eating patterns : eating well  -sleeping: no issues  -Additional issues or questions: none  Virtual Visit via Telephone Note  I connected with Chris Murphy on 10/31/19 at  9:00 AM EST by telephone and verified that I am speaking with the correct person using two identifiers.  Location: Patient: home Provider: office   I discussed the limitations, risks, security and privacy concerns of performing an evaluation and management service by telephone and the availability of in person appointments. I also discussed with the patient that there may be a patient responsible charge related to this service. The patient expressed understanding and agreed to proceed.   History of Present Illness:    Observations/Objective:   Assessment and Plan:   Follow Up Instructions:    I discussed the assessment and treatment plan with the patient. The patient was provided an opportunity to ask questions and all were answered. The patient agreed with the plan and demonstrated an understanding of the instructions.   The patient was advised to call back or seek an in-person evaluation if the symptoms worsen or if the condition fails to improve as anticipated.  I provided 20 minutes of non-face-to-face time during this encounter.   Handling medications well. No obvious side effects.  Patient works as a Actor now. Driving an 25 wheeler  with his wife. He is very much taking his medication on days when he is driving.  No new complaints.    Review of Systems No headache, no major weight loss or weight gain, no chest pain no back pain abdominal pain no change in bowel habits complete ROS otherwise negative     Objective:   Physical Exam   Virtual     Assessment & Plan:  Impression ADHD adult onset. Importance of medications reiterated. Potential increased risk of accidents while driving his 17 wheeler without medication reiterated. Patient notes he is completely compliant with medications. 4 months worth written

## 2019-11-12 ENCOUNTER — Encounter: Payer: Self-pay | Admitting: Family Medicine

## 2020-02-25 ENCOUNTER — Other Ambulatory Visit: Payer: Self-pay | Admitting: Family Medicine

## 2020-02-25 MED ORDER — METHYLPHENIDATE HCL ER (OSM) 54 MG PO TBCR: 54.0000 mg | EXTENDED_RELEASE_TABLET | ORAL | 0 refills | Status: DC

## 2020-02-25 NOTE — Telephone Encounter (Signed)
Last seen 10/31/2019 for ADHD. Please advise. Thank you

## 2020-02-25 NOTE — Telephone Encounter (Signed)
Sure that's okay

## 2020-02-25 NOTE — Telephone Encounter (Signed)
Patient has an appt this Thursday for med refill and his insurance doesn't go into effect until June 1st.  Patient wanting to know if he can get one refill and then reschedule his appt for sometime in June?  I haven't moved his appt yet b/c if we can't do the refill he will have to keep the appt as Self Pay.  [facility]

## 2020-02-28 ENCOUNTER — Ambulatory Visit: Payer: BC Managed Care – PPO | Admitting: Family Medicine

## 2020-03-19 ENCOUNTER — Ambulatory Visit: Payer: BC Managed Care – PPO | Admitting: Family Medicine

## 2020-03-19 ENCOUNTER — Other Ambulatory Visit: Payer: Self-pay

## 2020-03-19 ENCOUNTER — Encounter: Payer: Self-pay | Admitting: Family Medicine

## 2020-03-19 VITALS — BP 118/76 | HR 96 | Temp 97.4°F | Ht 72.0 in | Wt 308.0 lb

## 2020-03-19 DIAGNOSIS — F900 Attention-deficit hyperactivity disorder, predominantly inattentive type: Secondary | ICD-10-CM

## 2020-03-19 DIAGNOSIS — Z79899 Other long term (current) drug therapy: Secondary | ICD-10-CM

## 2020-03-19 DIAGNOSIS — F909 Attention-deficit hyperactivity disorder, unspecified type: Secondary | ICD-10-CM | POA: Diagnosis not present

## 2020-03-19 NOTE — Progress Notes (Signed)
Patient ID: Chris Murphy, male    DOB: September 18, 1991, 29 y.o.   MRN: [id number]   Chief Complaint  Patient presents with  . ADD   Subjective:    HPI  CC-ADHD check up. Takes concerta 54mg  daily. States med is working well and no concerns or problems.   Pt stating doing well on meds.  Has been on this dose about 4-5 yrs. Has predominately inattention with ADD. No cp, palp, HA or insomnia. No increased weight losses. Taking mon-Friday, doesn't usually take on weekends, unless needing to work. Working as Artist.    Medical History Chris Murphy has a past medical history of ADHD (attention deficit hyperactivity disorder), GERD (gastroesophageal reflux disease), History of idiopathic seizure, History of syncope, Left ureteral calculus, and Wears glasses.   Outpatient Encounter Medications as of 03/19/2020  Medication Sig  . methylphenidate (CONCERTA) 54 MG PO CR tablet Take 1 tablet (54 mg total) by mouth every morning.  . methylphenidate (CONCERTA) 54 MG PO CR tablet Take 1 tablet (54 mg total) by mouth every morning.  . methylphenidate (CONCERTA) 54 MG PO CR tablet Take 1 tablet (54 mg total) by mouth every morning.  . methylphenidate (CONCERTA) 54 MG PO CR tablet Take 1 tablet (54 mg total) by mouth every morning.  . [DISCONTINUED] methylphenidate (CONCERTA) 54 MG PO CR tablet Take 1 tablet (54 mg total) by mouth every morning.  . [DISCONTINUED] methylphenidate (CONCERTA) 54 MG PO CR tablet Take 1 tablet (54 mg total) by mouth every morning.  . [DISCONTINUED] methylphenidate (CONCERTA) 54 MG PO CR tablet Take 1 tablet (54 mg total) by mouth every morning.  . [DISCONTINUED] methylphenidate (CONCERTA) 54 MG PO CR tablet Take 1 tablet (54 mg total) by mouth every morning.   No facility-administered encounter medications on file as of 03/19/2020.     Review of Systems  Constitutional: Negative for appetite change, chills, fatigue, fever and unexpected weight change.  Cardiovascular:  Negative for chest pain and leg swelling.  Gastrointestinal: Negative for abdominal pain, diarrhea, nausea and vomiting.  Genitourinary: Negative for dysuria and frequency.  Skin: Negative for rash.  Neurological: Negative for dizziness, weakness and headaches.  Psychiatric/Behavioral: Positive for decreased concentration (controlled on meds.). Negative for agitation, behavioral problems, confusion, dysphoric mood, hallucinations, self-injury, sleep disturbance and suicidal ideas. The patient is not nervous/anxious and is not hyperactive.      Vitals BP 118/76   Pulse 96   Temp (!) 97.4 F (36.3 C)   Ht 6' (1.829 m)   Wt (!) 308 lb (139.7 kg)   SpO2 98%   BMI 41.77 kg/m   Objective:   Physical Exam Vitals and nursing note reviewed.  Constitutional:      General: He is not in acute distress.    Appearance: Normal appearance. He is not ill-appearing.  Cardiovascular:     Rate and Rhythm: Normal rate and regular rhythm.     Pulses: Normal pulses.     Heart sounds: Normal heart sounds. No murmur.  Pulmonary:     Effort: Pulmonary effort is normal. No respiratory distress.     Breath sounds: Normal breath sounds.  Musculoskeletal:        General: Normal range of motion.  Skin:    General: Skin is warm and dry.     Findings: No rash.  Neurological:     General: No focal deficit present.     Mental Status: He is alert and oriented to person, place, and time.  Psychiatric:  Mood and Affect: Mood normal.        Behavior: Behavior normal.        Thought Content: Thought content normal.        Judgment: Judgment normal.      Assessment and Plan   1. Attention deficit hyperactivity disorder (ADHD), predominantly inattentive type - methylphenidate (CONCERTA) 54 MG PO CR tablet; Take 1 tablet (54 mg total) by mouth every morning.  Dispense: 30 tablet; Refill: 0 - methylphenidate (CONCERTA) 54 MG PO CR tablet; Take 1 tablet (54 mg total) by mouth every morning.   Dispense: 30 tablet; Refill: 0 - methylphenidate (CONCERTA) 54 MG PO CR tablet; Take 1 tablet (54 mg total) by mouth every morning.  Dispense: 30 tablet; Refill: 0 - methylphenidate (CONCERTA) 54 MG PO CR tablet; Take 1 tablet (54 mg total) by mouth every morning.  Dispense: 30 tablet; Refill: 0 - Med List Attached Separately - Specimen status report  2. Long-term current use of stimulant - ToxASSURE Select 13 (MW), Urine - Med List Attached Separately - Specimen status report   UDS ordered.  Controlled substance agreement signed.  pmp reviewed.  No abberant activity.  F/u 4 months or prn.

## 2020-03-20 LAB — MED LIST ATTACHED SEPARATELY

## 2020-03-23 LAB — TOXASSURE SELECT 13 (MW), URINE

## 2020-03-23 LAB — SPECIMEN STATUS REPORT

## 2020-03-29 ENCOUNTER — Encounter: Payer: Self-pay | Admitting: Family Medicine

## 2020-03-29 MED ORDER — METHYLPHENIDATE HCL ER (OSM) 54 MG PO TBCR: 54.0000 mg | EXTENDED_RELEASE_TABLET | ORAL | 0 refills | Status: DC

## 2020-04-24 ENCOUNTER — Encounter: Payer: Self-pay | Admitting: Family Medicine

## 2020-04-24 ENCOUNTER — Ambulatory Visit: Payer: BC Managed Care – PPO | Admitting: Family Medicine

## 2020-04-24 ENCOUNTER — Other Ambulatory Visit: Payer: Self-pay

## 2020-04-24 VITALS — BP 128/80 | HR 100 | Temp 97.8°F | Ht 73.0 in | Wt 305.0 lb

## 2020-04-24 DIAGNOSIS — M7712 Lateral epicondylitis, left elbow: Secondary | ICD-10-CM

## 2020-04-24 DIAGNOSIS — M7711 Lateral epicondylitis, right elbow: Secondary | ICD-10-CM | POA: Diagnosis not present

## 2020-04-24 MED ORDER — DICLOFENAC SODIUM 75 MG PO TBEC: 75.0000 mg | DELAYED_RELEASE_TABLET | Freq: Two times a day (BID) | ORAL | 0 refills | Status: DC

## 2020-04-24 NOTE — Progress Notes (Signed)
   Patient ID: Chris Murphy, male    DOB: [birth year], 28 y.o.   MRN: [id number]   Chief Complaint  Patient presents with  . Elbow Pain   Subjective:    HPI  Cc- bilateral elbow and lower arm pain.  Started about one month ago. Tried icy hot and arthritis cream.  Hard to straighten out the elbows.  For 1 wk ago was pain in forearm. 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 Worse in last month. meds- taking icy hot and arhtritis cream. - iburpofen 600mg  once daily.   Medical History Chris Murphy has a past medical history of ADHD (attention deficit hyperactivity disorder), GERD (gastroesophageal reflux disease), History of idiopathic seizure, History of syncope, Left ureteral calculus, and Wears glasses.   Outpatient Encounter Medications as of 04/24/2020  Medication Sig  . methylphenidate (CONCERTA) 54 MG PO CR tablet Take 1 tablet (54 mg total) by mouth every morning.  . methylphenidate (CONCERTA) 54 MG PO CR tablet Take 1 tablet (54 mg total) by mouth every morning.  . methylphenidate (CONCERTA) 54 MG PO CR tablet Take 1 tablet (54 mg total) by mouth every morning.  . methylphenidate (CONCERTA) 54 MG PO CR tablet Take 1 tablet (54 mg total) by mouth every morning.  . diclofenac (VOLTAREN) 75 MG EC tablet Take 1 tablet (75 mg total) by mouth 2 (two) times daily.   No facility-administered encounter medications on file as of 04/24/2020.     Review of Systems  Constitutional: Negative for chills and fever.  Musculoskeletal: Negative for joint swelling.       +bilateral elbow pain.  Skin: Negative for rash.     Vitals BP 128/80   Pulse 100   Temp 97.8 F (36.6 C)   Ht 6\' 1"  (1.854 m)   Wt (!) 305 lb (138.3 kg)   SpO2 95%   BMI 40.24 kg/m   Objective:   Physical Exam Vitals and nursing note reviewed.  Constitutional:      General: He is not in acute distress.    Appearance: Normal appearance.    Musculoskeletal:        General: Tenderness present. No swelling or signs of injury. Normal range of motion.     Comments: +ttp over the extensor tendons of elbow bilaterally.  No swelling, erythema, or warmth over the olecranon. Normal rom of elbows, wrist/hands. No rash.  Skin:    General: Skin is warm and dry.     Findings: No rash.  Neurological:     General: No focal deficit present.     Mental Status: He is alert and oriented to person, place, and time.     Cranial Nerves: No cranial nerve deficit.      Assessment and Plan   1. Lateral epicondylitis of both elbows - diclofenac (VOLTAREN) 75 MG EC tablet; Take 1 tablet (75 mg total) by mouth 2 (two) times daily.  Dispense: 60 tablet; Refill: 0    Recommending diclofenac bid with food.  Rest, ice, and topical creams.  Exercises given. Also recommending brace for tennis elbow to order online or from pharmacy to use prn.  F/u 2-3 or prn if not improving.  Pt in agreement.

## 2020-04-24 NOTE — Patient Instructions (Signed)
Tennis Elbow Rehab Ask your health care provider which exercises are safe for you. Do exercises exactly as told by your health care provider and adjust them as directed. It is normal to feel mild stretching, pulling, tightness, or discomfort as you do these exercises. Stop right away if you feel sudden pain or your pain gets worse. Do not begin these exercises until told by your health care provider. Stretching and range-of-motion exercises These exercises warm up your muscles and joints and improve the movement and flexibility of your elbow. These exercises also help to relieve pain, numbness, and tingling. Wrist flexion, assisted  1. Straighten your left / right elbow in front of you with your palm facing down toward the floor. ? If told by your health care provider, bend your left / right elbow to a 90-degree angle (right angle) at your side. 2. With your other hand, gently push over the back of your left / right hand so your fingers point toward the floor (flexion). Stop when you feel a gentle stretch on the back of your forearm. 3. Hold this position for __________ seconds. Repeat __________ times. Complete this exercise __________ times a day. Wrist extension, assisted  1. Straighten your left / right elbow in front of you with your palm facing up toward the ceiling. ? If told by your health care provider, bend your left / right elbow to a 90-degree angle (right angle) at your side. 2. With your other hand, gently pull your left / right hand and fingers toward the floor (extension). Stop when you feel a gentle stretch on the palm side of your forearm. 3. Hold this position for __________ seconds. Repeat __________ times. Complete this exercise __________ times a day. Assisted forearm rotation, supination 1. Sit or stand with your left / right elbow bent to a 90-degree angle (right angle) at your side. 2. Using your uninjured hand, turn (rotate) your left / right palm up toward the ceiling  (supination) until you feel a gentle stretch along the inside of your forearm. 3. Hold this position for __________ seconds. Repeat __________ times. Complete this exercise __________ times a day. Assisted forearm rotation, pronation 1. Sit or stand with your left / right elbow bent to a 90-degree angle (right angle) at your side. 2. Using your uninjured hand, rotate your left / right palm down toward the floor (pronation) until you feel a gentle stretch along the outside of your forearm. 3. Hold this position for __________ seconds. Repeat __________ times. Complete this exercise __________ times a day. Strengthening exercises These exercises build strength and endurance in your forearm and elbow. Endurance is the ability to use your muscles for a long time, even after they get tired. Radial deviation  1. Stand with a __________ weight or a hammer in your left / right hand. Or, sit while holding a rubber exercise band or tubing, with your left / right forearm supported on a table or countertop. ? If you are standing, position your forearm so that your thumb is facing forward. If you are sitting, position your forearm so that the thumb is facing the ceiling. This is the neutral position. 2. Raise your hand upward in front of you so your thumb moves toward the ceiling (radial deviation), or pull up on the rubber tubing. Keep your forearm and elbow still while you move your wrist only. 3. Hold this position for __________ seconds. 4. Slowly return to the starting position. Repeat __________ times. Complete this exercise __________ times  a day. °Wrist extension, eccentric °1. Sit with your left / right forearm palm-down and supported on a table or other surface. Let your left / right wrist extend over the edge of the surface. °2. Hold a __________ weight or a piece of exercise band or tubing in your left / right hand. °? If using a rubber exercise band or tubing, hold the other end of the tubing with  your other hand. °3. Use your uninjured hand to move your left / right hand up toward the ceiling. °4. Take your uninjured hand away and slowly return to the starting position using only your left / right hand. Lowering your arm under tension is called eccentric extension. °Repeat __________ times. Complete this exercise __________ times a day. °Wrist extension °Do not do this exercise if it causes pain at the outside of your elbow. Only do this exercise once instructed by your health care provider. °1. Sit with your left / right forearm supported on a table or other surface and your palm turned down toward the floor. Let your left / right wrist extend over the edge of the surface. °2. Hold a __________ weight or a piece of rubber exercise band or tubing. °? If you are using a rubber exercise band or tubing, hold the band or tubing in place with your other hand to provide resistance. °3. Slowly bend your wrist so your hand moves up toward the ceiling (extension). Move only your wrist, keeping your forearm and elbow still. °4. Hold this position for __________ seconds. °5. Slowly return to the starting position. °Repeat __________ times. Complete this exercise __________ times a day. °Forearm rotation, supination °To do this exercise, you will need a lightweight hammer or rubber mallet. °1. Sit with your left / right forearm supported on a table or other surface. Bend your elbow to a 90-degree angle (right angle). Position your forearm so that your palm is facing down toward the floor, with your hand resting over the edge of the table. °2. Hold a hammer in your left / right hand. °? To make this exercise easier, hold the hammer near the head of the hammer. °? To make this exercise harder, hold the hammer near the end of the handle. °3. Without moving your wrist or elbow, slowly rotate your forearm so your palm faces up toward the ceiling (supination). °4. Hold this position for __________ seconds. °5. Slowly return  to the starting position. °Repeat __________ times. Complete this exercise __________ times a day. °Shoulder blade squeeze °1. Sit in a stable chair or stand with good posture. If you are sitting down, do not let your back touch the back of the chair. °2. Your arms should be at your sides with your elbows bent to a 90-degree angle (right angle). Position your forearms so that your thumbs are facing the ceiling (neutral position). °3. Without lifting your shoulders up, squeeze your shoulder blades tightly together. °4. Hold this position for __________ seconds. °5. Slowly release and return to the starting position. °Repeat __________ times. Complete this exercise __________ times a day. °This information is not intended to replace advice given to you by your health care provider. Make sure you discuss any questions you have with your health care provider. °Document Revised: 01/18/2019 Document Reviewed: 11/21/2018 °Elsevier Patient Education © 2020 [employer] ° °

## 2020-05-19 ENCOUNTER — Telehealth: Payer: Self-pay | Admitting: Family Medicine

## 2020-05-19 NOTE — Telephone Encounter (Signed)
[facility] contacted. [facility] does have refills on file. Contacted pt and pt verbalized understanding.

## 2020-05-19 NOTE — Telephone Encounter (Signed)
Patient said he should have refills on Concerta at [facility] but when he called for a refill they said they don't have anything for him.

## 2020-09-17 ENCOUNTER — Encounter: Payer: Self-pay | Admitting: Family Medicine

## 2020-09-17 ENCOUNTER — Ambulatory Visit: Payer: BC Managed Care – PPO | Admitting: Family Medicine

## 2020-09-17 ENCOUNTER — Other Ambulatory Visit: Payer: Self-pay

## 2020-09-17 VITALS — BP 128/82 | HR 91 | Temp 97.2°F | Ht 73.0 in | Wt 287.0 lb

## 2020-09-17 DIAGNOSIS — E669 Obesity, unspecified: Secondary | ICD-10-CM

## 2020-09-17 DIAGNOSIS — F900 Attention-deficit hyperactivity disorder, predominantly inattentive type: Secondary | ICD-10-CM

## 2020-09-17 MED ORDER — METHYLPHENIDATE HCL ER (OSM) 54 MG PO TBCR: 54.0000 mg | EXTENDED_RELEASE_TABLET | ORAL | 0 refills | Status: DC

## 2020-09-17 NOTE — Progress Notes (Signed)
Patient ID: Chris Murphy, male    DOB: 04-24-1991, 29 y.o.   MRN: [id number]   Chief Complaint  Patient presents with  . ADHD    follow up   Subjective:    HPI  F/u adhd follow up.  On last visit- Pt had some elbow pain and resolved after using the elbow braces.   Taking medications for concerta 54mg  and not having any side effects.  Not taking on weekends.   1 mo starting to try to lose weight 20 lbs weight loss.  Cutting back on soda and eating out. 305 in 7/21 and now down to 287 lbs.  Wondered about tightness of his stomach and if it is normal.  No pain in abd, normal BM, no pain with eating.  No vomiting.   Received request for refill of ADHD Medication.  Last refill date of Pyschostimulants: Concerta 54mg  CR Medication: # dispensed: 30, # days of med left: last date of pick up 08/14/20.  Does Chris Murphy seem to have any problems with moodiness, appetite, weight loss, or sleep? no Any complaints by Chris Murphy about taking the medications? no When was he last examined for ADHD? 4 mo ago. Who is he seeing for his ADHD symptoms? pcp When is his next appointment due? 4 mo   Medical History Chris Murphy has a past medical history of ADHD (attention deficit hyperactivity disorder), GERD (gastroesophageal reflux disease), History of idiopathic seizure, History of syncope, Left ureteral calculus, and Wears glasses.   Outpatient Encounter Medications as of 09/17/2020  Medication Sig  . diclofenac (VOLTAREN) 75 MG EC tablet Take 1 tablet (75 mg total) by mouth 2 (two) times daily. (Patient not taking: Reported on 09/17/2020)  . methylphenidate (CONCERTA) 54 MG PO CR tablet Take 1 tablet (54 mg total) by mouth every morning.  . methylphenidate (CONCERTA) 54 MG PO CR tablet Take 1 tablet (54 mg total) by mouth every morning.  . methylphenidate (CONCERTA) 54 MG PO CR tablet Take 1 tablet (54 mg total) by mouth every morning.  . methylphenidate (CONCERTA) 54 MG PO CR tablet Take 1 tablet (54  mg total) by mouth every morning.  . [DISCONTINUED] methylphenidate (CONCERTA) 54 MG PO CR tablet Take 1 tablet (54 mg total) by mouth every morning.  . [DISCONTINUED] methylphenidate (CONCERTA) 54 MG PO CR tablet Take 1 tablet (54 mg total) by mouth every morning.  . [DISCONTINUED] methylphenidate (CONCERTA) 54 MG PO CR tablet Take 1 tablet (54 mg total) by mouth every morning.  . [DISCONTINUED] methylphenidate (CONCERTA) 54 MG PO CR tablet Take 1 tablet (54 mg total) by mouth every morning.   No facility-administered encounter medications on file as of 09/17/2020.     Review of Systems  Constitutional: Negative for chills and fever.  HENT: Negative for congestion, rhinorrhea and sore throat.   Respiratory: Negative for cough, shortness of breath and wheezing.   Cardiovascular: Negative for chest pain and leg swelling.  Gastrointestinal: Negative for abdominal pain, diarrhea, nausea and vomiting.  Genitourinary: Negative for dysuria and frequency.  Skin: Negative for rash.  Neurological: Negative for dizziness, weakness and headaches.     Vitals BP 128/82   Pulse 91   Temp (!) 97.2 F (36.2 C) (Oral)   Ht 6\' 1"  (1.854 m)   Wt 287 lb (130.2 kg)   SpO2 97%   BMI 37.87 kg/m   Objective:   Physical Exam Vitals and nursing note reviewed.  Constitutional:      General: He is not in  acute distress.    Appearance: Normal appearance. He is not ill-appearing.  HENT:     Head: Normocephalic.     Nose: Nose normal. No congestion.     Mouth/Throat:     Mouth: Mucous membranes are moist.     Pharynx: No oropharyngeal exudate.  Eyes:     Extraocular Movements: Extraocular movements intact.     Conjunctiva/sclera: Conjunctivae normal.     Pupils: Pupils are equal, round, and reactive to light.  Cardiovascular:     Rate and Rhythm: Normal rate and regular rhythm.     Pulses: Normal pulses.     Heart sounds: Normal heart sounds. No murmur heard.   Pulmonary:     Effort:  Pulmonary effort is normal.     Breath sounds: Normal breath sounds. No wheezing, rhonchi or rales.  Abdominal:     General: Bowel sounds are normal. There is no distension.     Palpations: Abdomen is soft. There is no mass.     Tenderness: There is no abdominal tenderness. There is no guarding or rebound.     Hernia: No hernia is present.  Musculoskeletal:        General: Normal range of motion.     Right lower leg: No edema.     Left lower leg: No edema.  Skin:    General: Skin is warm and dry.     Findings: No rash.  Neurological:     General: No focal deficit present.     Mental Status: He is alert and oriented to person, place, and time.     Cranial Nerves: No cranial nerve deficit.  Psychiatric:        Mood and Affect: Mood normal.        Behavior: Behavior normal.        Thought Content: Thought content normal.        Judgment: Judgment normal.      Assessment and Plan   1. Attention deficit hyperactivity disorder (ADHD), predominantly inattentive type - methylphenidate (CONCERTA) 54 MG PO CR tablet; Take 1 tablet (54 mg total) by mouth every morning.  Dispense: 30 tablet; Refill: 0 - methylphenidate (CONCERTA) 54 MG PO CR tablet; Take 1 tablet (54 mg total) by mouth every morning.  Dispense: 30 tablet; Refill: 0 - methylphenidate (CONCERTA) 54 MG PO CR tablet; Take 1 tablet (54 mg total) by mouth every morning.  Dispense: 30 tablet; Refill: 0 - methylphenidate (CONCERTA) 54 MG PO CR tablet; Take 1 tablet (54 mg total) by mouth every morning.  Dispense: 30 tablet; Refill: 0  2. Obesity (BMI 35.0-39.9 without comorbidity)   Adhd- stable. pt doing well on meds.  No side effects.  Will cont same dose. concerta 54mg  CR.  Obesity-  Pt working on diet and lost about 20 lbs since last visit.  Cont to work on this and call if having any new concerns. Discussed adding in exercising.  F/u 4 mo or prn.

## 2020-09-17 NOTE — Progress Notes (Signed)
   Patient ID: Chris Murphy, male    DOB: 07-20-1991, 29 y.o.   MRN: [id number]   Chief Complaint  Patient presents with  . ADHD    follow up   Subjective:    HPI   Medical History Chris Murphy has a past medical history of ADHD (attention deficit hyperactivity disorder), GERD (gastroesophageal reflux disease), History of idiopathic seizure, History of syncope, Left ureteral calculus, and Wears glasses.   Outpatient Encounter Medications as of 09/17/2020  Medication Sig  . diclofenac (VOLTAREN) 75 MG EC tablet Take 1 tablet (75 mg total) by mouth 2 (two) times daily. (Patient not taking: Reported on 09/17/2020)  . methylphenidate (CONCERTA) 54 MG PO CR tablet Take 1 tablet (54 mg total) by mouth every morning.  . methylphenidate (CONCERTA) 54 MG PO CR tablet Take 1 tablet (54 mg total) by mouth every morning.  . methylphenidate (CONCERTA) 54 MG PO CR tablet Take 1 tablet (54 mg total) by mouth every morning.  . methylphenidate (CONCERTA) 54 MG PO CR tablet Take 1 tablet (54 mg total) by mouth every morning.   No facility-administered encounter medications on file as of 09/17/2020.     Review of Systems   Vitals BP 128/82   Pulse 91   Temp (!) 97.2 F (36.2 C) (Oral)   Ht 6\' 1"  (1.854 m)   Wt 287 lb (130.2 kg)   SpO2 97%   BMI 37.87 kg/m   Objective:   Physical Exam   Assessment and Plan   There are no diagnoses linked to this encounter.

## 2020-09-26 DIAGNOSIS — Z20822 Contact with and (suspected) exposure to covid-19: Secondary | ICD-10-CM | POA: Diagnosis not present

## 2020-09-26 DIAGNOSIS — Z03818 Encounter for observation for suspected exposure to other biological agents ruled out: Secondary | ICD-10-CM | POA: Diagnosis not present

## 2020-10-23 ENCOUNTER — Ambulatory Visit: Payer: BLUE CROSS/BLUE SHIELD | Attending: Internal Medicine

## 2020-10-23 DIAGNOSIS — Z23 Encounter for immunization: Secondary | ICD-10-CM

## 2020-10-23 NOTE — Progress Notes (Signed)
   Covid-[facility] Vaccination [facility]  Name:  Chancy Smigiel    MRN: [id number] DOB: 1991/03/04  10/23/2020  Mr. Prats was observed post Covid-19 immunization for 15 minutes without incident. He was provided with Vaccine Information Sheet and instruction to access the V-Safe system.   Mr. Raver was instructed to call 911 with any severe reactions post vaccine: Marland Kitchen Difficulty breathing  . Swelling of face and throat  . A fast heartbeat  . A bad rash all over body  . Dizziness and weakness   Immunizations Administered    Name Date Dose VIS Date Route   Pfizer COVID-19 Vaccine 10/23/2020  1:22 PM 0.3 mL 07/30/2020 Intramuscular   Manufacturer: [employer]   Lot: [id number]   NDC: [address]-2

## 2021-02-20 ENCOUNTER — Other Ambulatory Visit: Payer: Self-pay | Admitting: *Deleted

## 2021-02-20 ENCOUNTER — Telehealth: Payer: Self-pay | Admitting: Family Medicine

## 2021-02-20 MED ORDER — ONDANSETRON 8 MG PO TBDP: 8.0000 mg | ORAL_TABLET | Freq: Three times a day (TID) | ORAL | 1 refills | Status: AC | PRN

## 2021-02-20 NOTE — Telephone Encounter (Signed)
Zofran can help with nausea but may or may not stop vomiting.  Bland diet today clear liquids today if doing better tomorrow may advance diet  May send in Zofran 8 mg ODT 1 taken 3 times daily as needed for nausea, #12 with 1 refill

## 2021-02-20 NOTE — Telephone Encounter (Signed)
Patient has a stomach bug and wanting something called in for nausea to [facility].

## 2021-02-20 NOTE — Telephone Encounter (Signed)
Discussed with pt. Pt verbalized understanding.  °

## 2021-02-20 NOTE — Telephone Encounter (Signed)
People he was around yesterday are vomiting about every 15 mins and he had some diarrhea this morning.  Pt wants to have something on hand for nausea and vomiting because he states he has had seizures in the past with vomiting.  [facility].

## 2021-03-16 ENCOUNTER — Other Ambulatory Visit: Payer: Self-pay | Admitting: Family Medicine

## 2021-03-16 DIAGNOSIS — F900 Attention-deficit hyperactivity disorder, predominantly inattentive type: Secondary | ICD-10-CM

## 2021-03-16 NOTE — Telephone Encounter (Signed)
Last seen 09/17/20 for ADD check up. Has appt this week

## 2021-03-19 ENCOUNTER — Ambulatory Visit: Payer: BC Managed Care – PPO | Admitting: Family Medicine

## 2021-04-01 ENCOUNTER — Other Ambulatory Visit: Payer: Self-pay

## 2021-04-01 ENCOUNTER — Ambulatory Visit: Payer: BC Managed Care – PPO | Admitting: Family Medicine

## 2021-04-01 ENCOUNTER — Encounter: Payer: Self-pay | Admitting: Family Medicine

## 2021-04-01 VITALS — BP 128/78 | HR 89 | Temp 97.5°F | Wt 298.4 lb

## 2021-04-01 DIAGNOSIS — D225 Melanocytic nevi of trunk: Secondary | ICD-10-CM

## 2021-04-01 DIAGNOSIS — F900 Attention-deficit hyperactivity disorder, predominantly inattentive type: Secondary | ICD-10-CM | POA: Diagnosis not present

## 2021-04-01 MED ORDER — METHYLPHENIDATE HCL ER (OSM) 54 MG PO TBCR: 54.0000 mg | EXTENDED_RELEASE_TABLET | ORAL | 0 refills | Status: AC

## 2021-04-01 MED ORDER — METHYLPHENIDATE HCL ER (OSM) 54 MG PO TBCR: 54.0000 mg | EXTENDED_RELEASE_TABLET | ORAL | 0 refills | Status: DC

## 2021-04-01 NOTE — Progress Notes (Signed)
Patient ID: Chris Murphy, male    DOB: 1991-06-11, 30 y.o.   MRN: [id number]   Chief Complaint  Patient presents with   ADHD   Subjective:    HPI Patient was seen today for ADD checkup.  This patient does have ADD.  Patient takes medications for this.  If this does help control overall symptoms.  Please see below. -weight, vital signs reviewed.  The following items were covered. -Compliance with medication : Concerta 54 mg   -Problems with completing homework, paying attention/taking good notes in school: n/a, pt is working.  -grades: n/a  - Eating patterns : eating well   -sleeping: no issues   -Additional issues or questions: spot on middle area of back that appeared about one week ago. Slightly raised area; no burning, no itching.  Wife did place a circle around area  Has a mole or "spot wanting to have examined.  ADD medication-Taking it only on work days, off on his weekends from medications.  Medical History Real has a past medical history of ADHD (attention deficit hyperactivity disorder), GERD (gastroesophageal reflux disease), History of idiopathic seizure, History of syncope, Left ureteral calculus, and Wears glasses.   Outpatient Encounter Medications as of 04/01/2021  Medication Sig   ondansetron (ZOFRAN ODT) 8 MG disintegrating tablet Take 1 tablet (8 mg total) by mouth every 8 (eight) hours as needed for nausea or vomiting.   [DISCONTINUED] methylphenidate (CONCERTA) 54 MG PO CR tablet Take 1 tablet (54 mg total) by mouth every morning.   [DISCONTINUED] methylphenidate (CONCERTA) 54 MG PO CR tablet Take 1 tablet (54 mg total) by mouth every morning.   [DISCONTINUED] methylphenidate (CONCERTA) 54 MG PO CR tablet Take 1 tablet (54 mg total) by mouth every morning.   [DISCONTINUED] METHYLPHENIDATE 54 MG PO CR tablet TAKE 1 TABLET BY MOUTH EVERY MORNING   methylphenidate (CONCERTA) 54 MG PO CR tablet Take 1 tablet (54 mg total) by mouth every morning.    methylphenidate (CONCERTA) 54 MG PO CR tablet Take 1 tablet (54 mg total) by mouth every morning.   methylphenidate (CONCERTA) 54 MG PO CR tablet Take 1 tablet (54 mg total) by mouth every morning.   [DISCONTINUED] diclofenac (VOLTAREN) 75 MG EC tablet Take 1 tablet (75 mg total) by mouth 2 (two) times daily. (Patient not taking: Reported on 09/17/2020)   No facility-administered encounter medications on file as of 04/01/2021.     Review of Systems  Constitutional:  Negative for chills and fever.  HENT:  Negative for congestion, rhinorrhea and sore throat.   Respiratory:  Negative for cough, shortness of breath and wheezing.   Cardiovascular:  Negative for chest pain and leg swelling.  Gastrointestinal:  Negative for abdominal pain, diarrhea, nausea and vomiting.  Genitourinary:  Negative for dysuria and frequency.  Skin:  Negative for rash.  Neurological:  Negative for dizziness, weakness and headaches.  Psychiatric/Behavioral:  Negative for behavioral problems, decreased concentration, dysphoric mood, self-injury, sleep disturbance and suicidal ideas. The patient is not nervous/anxious.     Vitals BP 128/78   Pulse 89   Temp (!) 97.5 F (36.4 C)   Wt 298 lb 6.4 oz (135.4 kg)   SpO2 99%   BMI 39.37 kg/m   Objective:   Physical Exam Vitals and nursing note reviewed.  Constitutional:      General: He is not in acute distress.    Appearance: Normal appearance. He is not ill-appearing.  HENT:     Head: Normocephalic.  Nose: Nose normal. No congestion.     Mouth/Throat:     Mouth: Mucous membranes are moist.     Pharynx: No oropharyngeal exudate.  Eyes:     Extraocular Movements: Extraocular movements intact.     Conjunctiva/sclera: Conjunctivae normal.     Pupils: Pupils are equal, round, and reactive to light.  Cardiovascular:     Rate and Rhythm: Normal rate and regular rhythm.     Pulses: Normal pulses.     Heart sounds: Normal heart sounds. No murmur  heard. Pulmonary:     Effort: Pulmonary effort is normal.     Breath sounds: Normal breath sounds. No wheezing, rhonchi or rales.  Musculoskeletal:        General: Normal range of motion.     Right lower leg: No edema.     Left lower leg: No edema.  Skin:    General: Skin is warm and dry.     Findings: No rash.     Comments: +0.25cm skin colored, round mole, on mid back near spine. No surrounding erythema, bleeding, or drainage.    Neurological:     General: No focal deficit present.     Mental Status: He is alert and oriented to person, place, and time.     Cranial Nerves: No cranial nerve deficit.  Psychiatric:        Mood and Affect: Mood normal.        Behavior: Behavior normal.        Thought Content: Thought content normal.        Judgment: Judgment normal.     Assessment and Plan   1. Attention deficit hyperactivity disorder (ADHD), predominantly inattentive type - methylphenidate (CONCERTA) 54 MG PO CR tablet; Take 1 tablet (54 mg total) by mouth every morning.  Dispense: 30 tablet; Refill: 0 - methylphenidate (CONCERTA) 54 MG PO CR tablet; Take 1 tablet (54 mg total) by mouth every morning.  Dispense: 30 tablet; Refill: 0 - methylphenidate (CONCERTA) 54 MG PO CR tablet; Take 1 tablet (54 mg total) by mouth every morning.  Dispense: 30 tablet; Refill: 0  2. Nevus of back   Pt doing well on medications, helping with symptoms. Will cont concerta 54mg  cr.   Benign nevus on mid back, cont to monitor, -skin colored, regular, smooth round.  No irritation, bleeding, or erythema.  Return in about 3 months (around 07/02/2021) for adhd.

## 2021-06-10 DIAGNOSIS — F988 Other specified behavioral and emotional disorders with onset usually occurring in childhood and adolescence: Secondary | ICD-10-CM | POA: Diagnosis not present

## 2021-06-10 DIAGNOSIS — M722 Plantar fascial fibromatosis: Secondary | ICD-10-CM | POA: Diagnosis not present

## 2021-09-17 DIAGNOSIS — Z23 Encounter for immunization: Secondary | ICD-10-CM | POA: Diagnosis not present

## 2021-09-17 DIAGNOSIS — Z13220 Encounter for screening for lipoid disorders: Secondary | ICD-10-CM | POA: Diagnosis not present

## 2021-09-17 DIAGNOSIS — Z0000 Encounter for general adult medical examination without abnormal findings: Secondary | ICD-10-CM | POA: Diagnosis not present

## 2021-11-20 DIAGNOSIS — F988 Other specified behavioral and emotional disorders with onset usually occurring in childhood and adolescence: Secondary | ICD-10-CM | POA: Diagnosis not present

## 2021-11-20 DIAGNOSIS — R0683 Snoring: Secondary | ICD-10-CM | POA: Diagnosis not present

## 2021-11-20 DIAGNOSIS — Z87898 Personal history of other specified conditions: Secondary | ICD-10-CM | POA: Diagnosis not present

## 2021-11-25 ENCOUNTER — Other Ambulatory Visit: Payer: Self-pay

## 2021-11-25 ENCOUNTER — Encounter: Payer: Self-pay | Admitting: Internal Medicine

## 2021-11-25 ENCOUNTER — Ambulatory Visit (INDEPENDENT_AMBULATORY_CARE_PROVIDER_SITE_OTHER): Payer: BC Managed Care – PPO | Admitting: Internal Medicine

## 2021-11-25 VITALS — BP 121/76 | HR 105 | Ht 73.0 in | Wt 310.0 lb

## 2021-11-25 DIAGNOSIS — R569 Unspecified convulsions: Secondary | ICD-10-CM | POA: Diagnosis not present

## 2021-11-25 DIAGNOSIS — Z6841 Body Mass Index (BMI) 40.0 and over, adult: Secondary | ICD-10-CM | POA: Diagnosis not present

## 2021-11-25 DIAGNOSIS — G4733 Obstructive sleep apnea (adult) (pediatric): Secondary | ICD-10-CM | POA: Diagnosis not present

## 2021-11-25 NOTE — Patient Instructions (Signed)

## 2021-11-25 NOTE — Progress Notes (Signed)
Cardiology Office Note:    Date:  11/25/2021   ID:  Chris Murphy, DOB 1991/05/25, MRN [id number]  PCP:  Chris Murphy   [facility] Providers Cardiologist:  Chris Mayo, MD     Referring MD: Chris Colla, DO   No chief complaint on file. CMV clearance  History of Present Illness:    Chris Murphy is a 31 y.o. 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 In high school he used to wake up in the middle of the night, he would throw up and noted seizure activity. After high school they stopped. He had a cholecystectomy and these episodes resolved. He was told he had grand mal seizure and underwent extensive work up. No family hx of arrhythmia. No family hx of SCD. He has no history of  coronary disease, arrhythmia or congenital heart disease  Past Medical History:  Diagnosis Date   ADHD (attention deficit hyperactivity disorder)    GERD (gastroesophageal reflux disease)    watches diet   History of idiopathic seizure    hx 2006 seizures w/ vomiting; per pt had work-up unknown etiology/  none until may 2015 x1 seizure w/ vomiting     History of syncope    vasovagal episode   Left ureteral calculus    Wears glasses     Past Surgical History:  Procedure Laterality Date   CHOLECYSTECTOMY N/A 08/02/2016   Procedure: LAPAROSCOPIC CHOLECYSTECTOMY;  Surgeon: Chris Signs, MD;  Location: [facility];  Service: General;  Laterality: N/A;   CYSTOSCOPY WITH RETROGRADE PYELOGRAM, URETEROSCOPY AND STENT PLACEMENT Left 09/03/2014   Procedure: CYSTOSCOPY WITH LEFT RETROGRADE PYELOGRAM, LEFT URETEROSCOPY [place];  Surgeon: Chris Aloe, MD;  Location: [facility];  Service: Urology;  Laterality: Left;   CYSTOSCOPY WITH URETEROSCOPY AND STENT PLACEMENT Left 09/13/2014   Procedure: CYSTOSCOPY WITH LEFT  URETEROSCOPY AND STENT removal and replacement, Basket stone extraction ;  Surgeon: Chris Aloe, MD;  Location: [facility];  Service: Urology;  Laterality: Left;   EXTRACORPOREAL SHOCK WAVE LITHOTRIPSY Left left 07-04-2014  &  2005   HOLMIUM LASER APPLICATION Left 83/03/6293   Procedure: HOLMIUM LASER ;  Surgeon: Chris Aloe, MD;  Location: [facility];  Service: Urology;  Laterality: Left;   KNEE ARTHROSCOPY / MENISECTOMY/ SYNOVECTOMY/ DEBRIDEMENT PARTIAL ACL TEAR Right 06-28-2001    Current Medications: Current Meds  Medication Sig   methylphenidate (CONCERTA) 54 MG PO CR tablet Take 1 tablet (54 mg total) by mouth every morning.   ondansetron (ZOFRAN ODT) 8 MG disintegrating tablet Take 1 tablet (8 mg total) by mouth every 8 (eight) hours as needed for nausea or vomiting.     Allergies:   Patient has no known allergies.   Social History   Socioeconomic History   Marital status: Married    Spouse name: Not on file   Number of children: Not on file   Years of education: Not on file   Highest education level: Not on file  Occupational History   Not on file  Tobacco Use   Smoking status: Never   Smokeless tobacco: Never  Substance and Sexual Activity   Alcohol use: No   Drug use: No   Sexual activity: Not on file  Other Topics Concern   Not on file  Social History Narrative  Not on file   Social Determinants of Health   Financial Resource Strain: Not on file  Food Insecurity: Not on file  Transportation Needs: Not on file  Physical Activity: Not on file  Stress: Not on file  Social Connections: Not on file     Family History: The patient's per above  ROS:   Please see the history of present illness.     All other systems reviewed and are negative.  EKGs/Labs/Other Studies Reviewed:    The following studies were reviewed today:   EKG:  EKG is  ordered today.  The ekg ordered today demonstrates   Sinus tachycardia    Recent Labs: No results found for requested labs within last 8760 hours.  Recent Lipid Panel No results found for: CHOL, TRIG, HDL, CHOLHDL, VLDL, LDLCALC, LDLDIRECT   Risk Assessment/Calculations:           Physical Exam:    VS:  BP 121/76    Pulse (!) 105    Ht 6\' 1"  (1.854 m)    Wt (!) 310 lb (140.6 kg)    SpO2 96%    BMI 40.90 kg/m     Wt Readings from Last 3 Encounters:  11/25/21 (!) 310 lb (140.6 kg)  04/01/21 298 lb 6.4 oz (135.4 kg)  09/17/20 287 lb (130.2 kg)     GEN:  Well nourished, well developed in no acute distress HEENT: Normal NECK: No JVD; LYMPHATICS: No lymphadenopathy CARDIAC: RRR, no murmurs, rubs, gallops RESPIRATORY:  Clear to auscultation without rales, wheezing or rhonchi  ABDOMEN: Soft, non-tender, non-distended MUSCULOSKELETAL:  No edema; No deformity  SKIN: Warm and dry NEUROLOGIC:  Alert and oriented x 3 PSYCHIATRIC:  Normal affect   ASSESSMENT:    #Seizure: he had seizures in the past that resolved. He does not have history of cardiac syncope. He has no hx of heart disease. He has no family hx of SCD. His EKG shows sinus tachycardia, he notes going up a few flights of stairs prior to the visit. Otherwise no abnormalities. He is safe to drive CMV. A letter was given to him to provide to his employer.  PLAN:    In order of problems listed above:  No need to follow up with cardiology at this time      Medication Adjustments/Labs and Tests Ordered: Current medicines are reviewed at length with the patient today.  Concerns regarding medicines are outlined above.  No orders of the defined types were placed in this encounter.  No orders of the defined types were placed in this encounter.   Patient Instructions  Medication Instructions:  No Changes In Medications at this time.  *If you need a refill on your cardiac medications before your next appointment, please call your pharmacy*  Follow-Up: At [facility], you and your health  needs are our priority.  As part of our continuing mission to provide you with exceptional heart care, we have created designated Provider Care Teams.  These Care Teams include your primary Cardiologist (physician) and Advanced Practice Providers (APPs -  Physician Assistants and Nurse Practitioners) who all work together to provide you with the care you need, when you need it.  Your next appointment:   AS NEEDED   The format for your next appointment:   In Person  Provider:   Janina Mayo, MD     Signed, Chris Mayo, MD  11/25/2021 4:15 PM    [place]

## 2021-11-30 ENCOUNTER — Encounter: Payer: Self-pay | Admitting: Neurology

## 2021-11-30 ENCOUNTER — Ambulatory Visit (INDEPENDENT_AMBULATORY_CARE_PROVIDER_SITE_OTHER): Payer: BC Managed Care – PPO | Admitting: Neurology

## 2021-11-30 VITALS — BP 123/86 | HR 92 | Ht 73.0 in | Wt 308.0 lb

## 2021-11-30 DIAGNOSIS — R569 Unspecified convulsions: Secondary | ICD-10-CM

## 2021-11-30 DIAGNOSIS — Z6841 Body Mass Index (BMI) 40.0 and over, adult: Secondary | ICD-10-CM | POA: Insufficient documentation

## 2021-11-30 DIAGNOSIS — Z87898 Personal history of other specified conditions: Secondary | ICD-10-CM

## 2021-11-30 DIAGNOSIS — G4733 Obstructive sleep apnea (adult) (pediatric): Secondary | ICD-10-CM | POA: Insufficient documentation

## 2021-11-30 NOTE — Patient Instructions (Signed)
Routine EEG, Will contact you to go over the result.  Follow up as needed

## 2021-11-30 NOTE — Progress Notes (Signed)
[facility]  PATIENT: Chris Murphy DOB: Jan 11, 1991  REQUESTING CLINICIAN: Erven Colla, DO HISTORY FROM: Patient  REASON FOR VISIT: History of seizure    HISTORICAL  CHIEF COMPLAINT:  Chief Complaint  Patient presents with   New Patient (Initial Visit)    Rm 12. Alone. 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    HISTORY OF PRESENT ILLNESS:  This is a 31 year old gentleman with past medical history of ADD and history of seizure as a teenager who is presenting to establish care and for also CDL clearance.  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  She reports seizures while in high school from 2006-2010, the last seizure was in 2010.  He was told he had a grand mal seizure but reported seeing a epileptologist, had a brain scan and EEG and was told that he did not have epilepsy.  He reported his seizures to happen at night, after throwing up he will have a seizure and they all started after his gallbladder was removed.  He has never been on any antiseizure medication.  Again last seizure was 2010 and he has been doing fine since.  Denies any other complaint.  Denies any family history of seizures or any other seizure risk factors.    Handedness: Right handed   Onset: 2006  Seizure Type: Grand mal   Current frequency: Last seizure 2010  Any injuries from seizures: No   Seizure risk factors: No risk factor reported  Previous ASMs: None  Currenty ASMs: None  ASMs side effects: Not applicable  Brain Images: None  Previous EEGs: Was told normal    OTHER MEDICAL CONDITIONS: ADD  REVIEW OF SYSTEMS: Full 14 system review of systems performed and negative with exception of: as noted in the HPI   ALLERGIES: No Known Allergies  HOME  MEDICATIONS: Outpatient Medications Prior to Visit  Medication Sig Dispense Refill   methylphenidate (CONCERTA) 54 MG PO CR tablet Take 1 tablet (54 mg total) by mouth every morning. 30 tablet 0   ondansetron (ZOFRAN ODT) 8 MG disintegrating tablet Take 1 tablet (8 mg total) by mouth every 8 (eight) hours as needed for nausea or vomiting. 12 tablet 1   methylphenidate (CONCERTA) 54 MG PO CR tablet Take 1 tablet (54 mg total) by mouth every morning. (Patient not taking: Reported on 11/25/2021) 30 tablet 0   methylphenidate (CONCERTA) 54 MG PO CR tablet Take 1 tablet (54 mg total) by mouth every morning. (Patient not taking: Reported on 11/25/2021) 30 tablet 0   No facility-administered medications prior to visit.    PAST MEDICAL HISTORY: Past Medical History:  Diagnosis Date   ADHD (attention deficit hyperactivity disorder)    GERD (gastroesophageal reflux disease)    watches diet   History of idiopathic seizure    hx 2006 seizures w/ vomiting; per pt had work-up unknown etiology/  none until may 2015 x1 seizure w/ vomiting     History of syncope    vasovagal episode   Left ureteral calculus    Wears glasses     PAST SURGICAL HISTORY: Past Surgical History:  Procedure Laterality Date   CHOLECYSTECTOMY N/A 08/02/2016   Procedure: LAPAROSCOPIC CHOLECYSTECTOMY;  Surgeon: Aviva Signs, MD;  Location: [facility];  Service: General;  Laterality: N/A;  CYSTOSCOPY WITH RETROGRADE PYELOGRAM, URETEROSCOPY AND STENT PLACEMENT Left 09/03/2014   Procedure: CYSTOSCOPY WITH LEFT RETROGRADE PYELOGRAM, LEFT URETEROSCOPY AND  DOUBLE J STENT PLACEMENT;  Surgeon: Festus Aloe, MD;  [facility];  Service: Urology;  Laterality: Left;   CYSTOSCOPY WITH URETEROSCOPY AND STENT PLACEMENT Left 09/13/2014   Procedure: CYSTOSCOPY WITH LEFT URETEROSCOPY AND STENT removal and replacement, Basket stone extraction ;  Surgeon: Festus Aloe, MD;  Location: [facility];   Service: Urology;  Laterality: Left;   EXTRACORPOREAL SHOCK WAVE LITHOTRIPSY Left left 07-04-2014  &  2005   HOLMIUM LASER APPLICATION Left 78/11/4233   Procedure: HOLMIUM LASER ;  Surgeon: Festus Aloe, MD;  Location: [facility];  Service: Urology;  Laterality: Left;   KNEE ARTHROSCOPY / MENISECTOMY/ SYNOVECTOMY/ DEBRIDEMENT PARTIAL ACL TEAR Right 06-28-2001    FAMILY HISTORY: History reviewed. No pertinent family history.  SOCIAL HISTORY: Social History   Socioeconomic History   Marital status: Married    Spouse name: Not on file   Number of children: Not on file   Years of education: Not on file   Highest education level: Not on file  Occupational History   Not on file  Tobacco Use   Smoking status: Never   Smokeless tobacco: Never  Substance and Sexual Activity   Alcohol use: No   Drug use: No   Sexual activity: Not on file  Other Topics Concern   Not on file  Social History Narrative   Not on file   Social Determinants of Health   Financial Resource Strain: Not on file  Food Insecurity: Not on file  Transportation Needs: Not on file  Physical Activity: Not on file  Stress: Not on file  Social Connections: Not on file  Intimate Partner Violence: Not on file     PHYSICAL EXAM  GENERAL EXAM/CONSTITUTIONAL: Vitals:  Vitals:   11/30/21 1341  BP: 123/86  Pulse: 92  Weight: (!) 308 lb (139.7 kg)  Height: 6\' 1"  (1.854 m)   Body mass index is 40.64 kg/m. Wt Readings from Last 3 Encounters:  11/30/21 (!) 308 lb (139.7 kg)  11/25/21 (!) 310 lb (140.6 kg)  04/01/21 298 lb 6.4 oz (135.4 kg)   Patient is in no distress; well developed, nourished and groomed; neck is supple  CARDIOVASCULAR: Examination of carotid arteries is normal; no carotid bruits Regular rate and rhythm, no murmurs Examination of peripheral vascular system by observation and palpation is normal  EYES: Pupils round and reactive to light, Visual fields full to  confrontation, Extraocular movements intacts,  No results found.  MUSCULOSKELETAL: Gait, strength, tone, movements noted in Neurologic exam below  NEUROLOGIC: MENTAL STATUS:  No flowsheet data found. awake, alert, oriented to person, place and time recent and remote memory intact normal attention and concentration language fluent, comprehension intact, naming intact fund of knowledge appropriate  CRANIAL NERVE:  2nd, 3rd, 4th, 6th - pupils equal and reactive to light, visual fields full to confrontation, extraocular muscles intact, no nystagmus 5th - facial sensation symmetric 7th - facial strength symmetric 8th - hearing intact 9th - palate elevates symmetrically, uvula midline 11th - shoulder shrug symmetric 12th - tongue protrusion midline  MOTOR:  normal bulk and tone, full strength in the BUE, BLE  SENSORY:  normal and symmetric to light touch, pinprick, temperature, vibration  COORDINATION:  finger-nose-finger, fine finger movements normal  REFLEXES:  deep tendon reflexes present and symmetric  GAIT/STATION:  normal     DIAGNOSTIC  DATA (LABS, IMAGING, TESTING) - I reviewed patient records, labs, notes, testing and imaging myself where available.  Lab Results  Component Value Date   WBC 8.8 07/29/2016   HGB 14.9 07/29/2016   HCT 43.3 07/29/2016   MCV 82.0 07/29/2016   PLT 238 07/29/2016      Component Value Date/Time   NA 136 07/29/2016 0957   K 4.1 07/29/2016 0957   CL 103 07/29/2016 0957   CO2 26 07/29/2016 0957   GLUCOSE 95 07/29/2016 0957   BUN 16 07/29/2016 0957   CREATININE 0.80 07/29/2016 0957   CALCIUM 9.3 07/29/2016 0957   PROT 7.9 07/29/2016 0957   ALBUMIN 4.3 07/29/2016 0957   AST 21 07/29/2016 0957   ALT 38 07/29/2016 0957   ALKPHOS 86 07/29/2016 0957   BILITOT 0.6 07/29/2016 0957   GFRNONAA >60 07/29/2016 0957   GFRAA >60 07/29/2016 0957   No results found for: CHOL, HDL, LDLCALC, LDLDIRECT, TRIG No results found for:  HGBA1C No results found for: VITAMINB12 No results found for: TSH   ASSESSMENT AND PLAN  31 y.o. 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  Patient reports having seizures from 2006-2010 in the setting of being sick, reports that seizure  will happen after vomiting. During this time, he was not on any antiseizure medication and seizure stopped after his gallbladder surgery. He reports following up with the epileptologist, had brain scan and EEG and he was told that he did not have epilepsy.  Again, he was not on any antiseizure medication.  Last seizure was in 2010, since then he has been doing fine.  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  Because of his history of seizures, I will obtain a routine EEG.  If routine EEG is normal, then from a neurological standpoint patient is cleared to drive as he has not had a seizure for the past 13 years while off anti seizure medications.  I will contact him after the completion of the EEG.   1. History of seizure   2. Morbid obesity ([place])     Patient Instructions  Routine EEG, Will contact you to go over the result.  Follow up as needed    Per [facility] statutes, patients with seizures are not allowed to drive until they have been seizure-free for six months.  Other recommendations include using caution when using heavy equipment or power tools. Avoid working on ladders or at heights. Take showers instead of baths.  Do not swim alone.  Ensure the water temperature is not too high on the home water heater. Do not go swimming alone. Do not lock yourself in a room alone (i.e. bathroom). When caring for infants or small children, sit down when holding, feeding, or changing them to minimize risk of injury to the child in the event you have a seizure. Maintain good sleep hygiene. Avoid alcohol.  Also recommend adequate sleep,  hydration, good diet and minimize stress.   During the Seizure  - First, ensure adequate ventilation and place patients on the floor on their left side  Loosen clothing around the neck and ensure the airway is patent. If the patient is clenching the teeth, do not force the mouth open with any object as this can cause severe damage - Remove all items from the surrounding that can be hazardous. The patient  may be oblivious to what's happening and may not even know what he or she is doing. If the patient is confused and wandering, either gently guide him/her away and block access to outside areas - Reassure the individual and be comforting - Call 911. In most cases, the seizure ends before EMS arrives. However, there are cases when seizures may last over 3 to 5 minutes. Or the individual may have developed breathing difficulties or severe injuries. If a pregnant patient or a person with diabetes develops a seizure, it is prudent to call an ambulance. - Finally, if the patient does not regain full consciousness, then call EMS. Most patients will remain confused for about 45 to 90 minutes after a seizure, so you must use judgment in calling for help. - Avoid restraints but make sure the patient is in a bed with padded side rails - Place the individual in a lateral position with the neck slightly flexed; this will help the saliva drain from the mouth and prevent the tongue from falling backward - Remove all nearby furniture and other hazards from the area - Provide verbal assurance as the individual is regaining consciousness - Provide the patient with privacy if possible - Call for help and start treatment as ordered by the caregiver   After the Seizure (Postictal Stage)  After a seizure, most patients experience confusion, fatigue, muscle pain and/or a headache. Thus, one should permit the individual to sleep. For the next few days, reassurance is essential. Being calm and helping reorient the  person is also of importance.  Most seizures are painless and end spontaneously. Seizures are not harmful to others but can lead to complications such as stress on the lungs, brain and the heart. Individuals with prior lung problems may develop labored breathing and respiratory distress.     Orders Placed This Encounter  Procedures   EEG adult    No orders of the defined types were placed in this encounter.   Return if symptoms worsen or fail to improve.    Alric Ran, MD 11/30/2021, 4:39 PM  [facility] [address] [place], [place] [address] [phone]

## 2021-12-07 DIAGNOSIS — G4733 Obstructive sleep apnea (adult) (pediatric): Secondary | ICD-10-CM | POA: Diagnosis not present

## 2021-12-08 ENCOUNTER — Other Ambulatory Visit: Payer: BC Managed Care – PPO | Admitting: *Deleted

## 2021-12-14 ENCOUNTER — Ambulatory Visit (INDEPENDENT_AMBULATORY_CARE_PROVIDER_SITE_OTHER): Payer: BC Managed Care – PPO | Admitting: Neurology

## 2021-12-14 DIAGNOSIS — Z87898 Personal history of other specified conditions: Secondary | ICD-10-CM

## 2021-12-14 DIAGNOSIS — R569 Unspecified convulsions: Secondary | ICD-10-CM | POA: Diagnosis not present

## 2021-12-14 NOTE — Procedures (Signed)
? ? ?  History: ? ?31 year old man with history of seizure ? ?EEG classification: Awake and drowsy ? ?Description of the recording: The background rhythms of this recording consists of a fairly well modulated medium amplitude alpha rhythm of 9-10 Hz that is reactive to eye opening and closure. As the record progresses, the patient appears to remain in the waking state throughout the recording. Photic stimulation was performed, did not show any abnormalities. Hyperventilation was also performed, did not show any abnormalities. Toward the end of the recording, the patient enters the drowsy state with slight symmetric slowing seen. The patient never enters stage II sleep. No abnormal epileptiform discharges seen during this recording. There was no focal slowing. EKG monitor shows no evidence of cardiac rhythm abnormalities with a heart rate of 66. ? ?Impression: This is a normal EEG recording in the waking and drowsy state. No evidence of interictal epileptiform discharges seen. A normal EEG does not exclude a diagnosis of epilepsy.  ? ? ?Alric Ran, MD ?[facility] ?  ?

## 2021-12-16 ENCOUNTER — Encounter: Payer: Self-pay | Admitting: Neurology

## 2021-12-16 ENCOUNTER — Telehealth: Payer: Self-pay | Admitting: Neurology

## 2021-12-16 NOTE — Telephone Encounter (Signed)
Done. Thank you.

## 2021-12-16 NOTE — Telephone Encounter (Signed)
Letter placed up front  

## 2021-12-16 NOTE — Telephone Encounter (Signed)
Pt inquiring about EEG results.  ?

## 2021-12-16 NOTE — Telephone Encounter (Signed)
Mychart message sent on 12/14/21: ? ?Your EEG (Brain wave test) was normal. In particular, there were no epileptiform discharges and no seizures. No further action is required on this test at this time. Please keep any upcoming appointments or tests and  call us with any interim questions, concerns, problems or updates. Thanks,  ?  ?If you need the DMV form completed, please drop it at the front desk and I will have it completed.  ?__________________________________ ?I spoke to the patient and provided him with the EEG results.  ? ?He is working on retaining is Pensions consultant.  ? 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 He would like this printed on our letterhead and signed by Dr. April Manson. He would like to come by out office in the morning to pick it up.  ?

## 2022-01-04 DIAGNOSIS — G4733 Obstructive sleep apnea (adult) (pediatric): Secondary | ICD-10-CM | POA: Diagnosis not present

## 2022-01-08 DIAGNOSIS — G4733 Obstructive sleep apnea (adult) (pediatric): Secondary | ICD-10-CM | POA: Diagnosis not present

## 2022-01-20 DIAGNOSIS — F988 Other specified behavioral and emotional disorders with onset usually occurring in childhood and adolescence: Secondary | ICD-10-CM | POA: Diagnosis not present

## 2022-02-01 DIAGNOSIS — G4733 Obstructive sleep apnea (adult) (pediatric): Secondary | ICD-10-CM | POA: Diagnosis not present

## 2022-02-04 DIAGNOSIS — G4733 Obstructive sleep apnea (adult) (pediatric): Secondary | ICD-10-CM | POA: Diagnosis not present

## 2022-02-24 DIAGNOSIS — F988 Other specified behavioral and emotional disorders with onset usually occurring in childhood and adolescence: Secondary | ICD-10-CM | POA: Diagnosis not present

## 2022-02-24 DIAGNOSIS — M79672 Pain in left foot: Secondary | ICD-10-CM | POA: Diagnosis not present

## 2022-03-06 DIAGNOSIS — G4733 Obstructive sleep apnea (adult) (pediatric): Secondary | ICD-10-CM | POA: Diagnosis not present

## 2022-04-30 DIAGNOSIS — E781 Pure hyperglyceridemia: Secondary | ICD-10-CM | POA: Diagnosis not present

## 2022-04-30 DIAGNOSIS — R635 Abnormal weight gain: Secondary | ICD-10-CM | POA: Diagnosis not present

## 2022-04-30 DIAGNOSIS — D649 Anemia, unspecified: Secondary | ICD-10-CM | POA: Diagnosis not present

## 2022-04-30 DIAGNOSIS — R7309 Other abnormal glucose: Secondary | ICD-10-CM | POA: Diagnosis not present

## 2022-04-30 DIAGNOSIS — E559 Vitamin D deficiency, unspecified: Secondary | ICD-10-CM | POA: Diagnosis not present

## 2022-04-30 DIAGNOSIS — Z125 Encounter for screening for malignant neoplasm of prostate: Secondary | ICD-10-CM | POA: Diagnosis not present

## 2022-04-30 DIAGNOSIS — Z6841 Body Mass Index (BMI) 40.0 and over, adult: Secondary | ICD-10-CM | POA: Diagnosis not present

## 2022-04-30 DIAGNOSIS — E291 Testicular hypofunction: Secondary | ICD-10-CM | POA: Diagnosis not present

## 2022-05-07 DIAGNOSIS — R635 Abnormal weight gain: Secondary | ICD-10-CM | POA: Diagnosis not present

## 2022-05-07 DIAGNOSIS — E8881 Metabolic syndrome: Secondary | ICD-10-CM | POA: Diagnosis not present

## 2022-05-07 DIAGNOSIS — Z1331 Encounter for screening for depression: Secondary | ICD-10-CM | POA: Diagnosis not present

## 2022-05-07 DIAGNOSIS — E291 Testicular hypofunction: Secondary | ICD-10-CM | POA: Diagnosis not present

## 2022-05-07 DIAGNOSIS — Z1339 Encounter for screening examination for other mental health and behavioral disorders: Secondary | ICD-10-CM | POA: Diagnosis not present

## 2022-05-07 DIAGNOSIS — E559 Vitamin D deficiency, unspecified: Secondary | ICD-10-CM | POA: Diagnosis not present

## 2022-05-21 DIAGNOSIS — E8881 Metabolic syndrome: Secondary | ICD-10-CM | POA: Diagnosis not present

## 2022-05-21 DIAGNOSIS — Z6841 Body Mass Index (BMI) 40.0 and over, adult: Secondary | ICD-10-CM | POA: Diagnosis not present

## 2022-06-04 DIAGNOSIS — Z6841 Body Mass Index (BMI) 40.0 and over, adult: Secondary | ICD-10-CM | POA: Diagnosis not present

## 2022-06-04 DIAGNOSIS — E8881 Metabolic syndrome: Secondary | ICD-10-CM | POA: Diagnosis not present

## 2022-06-18 DIAGNOSIS — F988 Other specified behavioral and emotional disorders with onset usually occurring in childhood and adolescence: Secondary | ICD-10-CM | POA: Diagnosis not present

## 2022-06-25 DIAGNOSIS — Z125 Encounter for screening for malignant neoplasm of prostate: Secondary | ICD-10-CM | POA: Diagnosis not present

## 2022-06-25 DIAGNOSIS — Z1329 Encounter for screening for other suspected endocrine disorder: Secondary | ICD-10-CM | POA: Diagnosis not present

## 2022-06-25 DIAGNOSIS — E559 Vitamin D deficiency, unspecified: Secondary | ICD-10-CM | POA: Diagnosis not present

## 2022-06-25 DIAGNOSIS — E291 Testicular hypofunction: Secondary | ICD-10-CM | POA: Diagnosis not present

## 2022-07-02 DIAGNOSIS — E291 Testicular hypofunction: Secondary | ICD-10-CM | POA: Diagnosis not present

## 2022-07-02 DIAGNOSIS — E8881 Metabolic syndrome: Secondary | ICD-10-CM | POA: Diagnosis not present

## 2022-07-16 DIAGNOSIS — F321 Major depressive disorder, single episode, moderate: Secondary | ICD-10-CM | POA: Diagnosis not present

## 2022-07-16 DIAGNOSIS — E781 Pure hyperglyceridemia: Secondary | ICD-10-CM | POA: Diagnosis not present

## 2022-07-16 DIAGNOSIS — E291 Testicular hypofunction: Secondary | ICD-10-CM | POA: Diagnosis not present

## 2022-07-16 DIAGNOSIS — R7989 Other specified abnormal findings of blood chemistry: Secondary | ICD-10-CM | POA: Diagnosis not present

## 2022-07-23 DIAGNOSIS — F321 Major depressive disorder, single episode, moderate: Secondary | ICD-10-CM | POA: Diagnosis not present

## 2022-07-27 DIAGNOSIS — F321 Major depressive disorder, single episode, moderate: Secondary | ICD-10-CM | POA: Diagnosis not present

## 2022-07-30 DIAGNOSIS — F321 Major depressive disorder, single episode, moderate: Secondary | ICD-10-CM | POA: Diagnosis not present

## 2022-07-30 DIAGNOSIS — F418 Other specified anxiety disorders: Secondary | ICD-10-CM | POA: Diagnosis not present

## 2022-08-06 DIAGNOSIS — R7989 Other specified abnormal findings of blood chemistry: Secondary | ICD-10-CM | POA: Diagnosis not present

## 2022-08-06 DIAGNOSIS — E291 Testicular hypofunction: Secondary | ICD-10-CM | POA: Diagnosis not present

## 2022-08-06 DIAGNOSIS — E559 Vitamin D deficiency, unspecified: Secondary | ICD-10-CM | POA: Diagnosis not present

## 2022-08-06 DIAGNOSIS — F321 Major depressive disorder, single episode, moderate: Secondary | ICD-10-CM | POA: Diagnosis not present

## 2022-08-06 DIAGNOSIS — Z6839 Body mass index (BMI) 39.0-39.9, adult: Secondary | ICD-10-CM | POA: Diagnosis not present

## 2022-08-12 DIAGNOSIS — F321 Major depressive disorder, single episode, moderate: Secondary | ICD-10-CM | POA: Diagnosis not present

## 2022-08-20 DIAGNOSIS — R197 Diarrhea, unspecified: Secondary | ICD-10-CM | POA: Diagnosis not present

## 2022-08-20 DIAGNOSIS — E88819 Insulin resistance, unspecified: Secondary | ICD-10-CM | POA: Diagnosis not present

## 2022-08-20 DIAGNOSIS — F4322 Adjustment disorder with anxiety: Secondary | ICD-10-CM | POA: Diagnosis not present

## 2022-08-20 DIAGNOSIS — F439 Reaction to severe stress, unspecified: Secondary | ICD-10-CM | POA: Diagnosis not present

## 2022-08-26 DIAGNOSIS — F321 Major depressive disorder, single episode, moderate: Secondary | ICD-10-CM | POA: Diagnosis not present

## 2022-08-26 DIAGNOSIS — F4322 Adjustment disorder with anxiety: Secondary | ICD-10-CM | POA: Diagnosis not present

## 2022-08-27 DIAGNOSIS — Z6838 Body mass index (BMI) 38.0-38.9, adult: Secondary | ICD-10-CM | POA: Diagnosis not present

## 2022-08-27 DIAGNOSIS — E781 Pure hyperglyceridemia: Secondary | ICD-10-CM | POA: Diagnosis not present

## 2022-09-10 DIAGNOSIS — F4322 Adjustment disorder with anxiety: Secondary | ICD-10-CM | POA: Diagnosis not present

## 2022-09-15 DIAGNOSIS — F321 Major depressive disorder, single episode, moderate: Secondary | ICD-10-CM | POA: Diagnosis not present

## 2022-09-15 DIAGNOSIS — F4322 Adjustment disorder with anxiety: Secondary | ICD-10-CM | POA: Diagnosis not present

## 2022-09-17 DIAGNOSIS — E781 Pure hyperglyceridemia: Secondary | ICD-10-CM | POA: Diagnosis not present

## 2022-09-17 DIAGNOSIS — Q984 Klinefelter syndrome, unspecified: Secondary | ICD-10-CM | POA: Diagnosis not present

## 2022-09-17 DIAGNOSIS — Z6838 Body mass index (BMI) 38.0-38.9, adult: Secondary | ICD-10-CM | POA: Diagnosis not present

## 2022-09-24 DIAGNOSIS — F988 Other specified behavioral and emotional disorders with onset usually occurring in childhood and adolescence: Secondary | ICD-10-CM | POA: Diagnosis not present

## 2022-09-24 DIAGNOSIS — N529 Male erectile dysfunction, unspecified: Secondary | ICD-10-CM | POA: Diagnosis not present

## 2022-09-24 DIAGNOSIS — F439 Reaction to severe stress, unspecified: Secondary | ICD-10-CM | POA: Diagnosis not present

## 2022-10-14 DIAGNOSIS — F321 Major depressive disorder, single episode, moderate: Secondary | ICD-10-CM | POA: Diagnosis not present

## 2022-12-02 DIAGNOSIS — N529 Male erectile dysfunction, unspecified: Secondary | ICD-10-CM | POA: Diagnosis not present

## 2022-12-02 DIAGNOSIS — F988 Other specified behavioral and emotional disorders with onset usually occurring in childhood and adolescence: Secondary | ICD-10-CM | POA: Diagnosis not present

## 2022-12-02 DIAGNOSIS — F439 Reaction to severe stress, unspecified: Secondary | ICD-10-CM | POA: Diagnosis not present

## 2023-01-28 ENCOUNTER — Other Ambulatory Visit: Payer: Self-pay | Admitting: Family Medicine

## 2023-01-28 ENCOUNTER — Ambulatory Visit
Admission: RE | Admit: 2023-01-28 | Discharge: 2023-01-28 | Disposition: A | Discharge status: Home / Self Care | Payer: BC Managed Care – PPO | Source: Ambulatory Visit | Attending: Family Medicine | Admitting: Family Medicine

## 2023-01-28 DIAGNOSIS — E291 Testicular hypofunction: Secondary | ICD-10-CM | POA: Diagnosis not present

## 2023-01-28 DIAGNOSIS — Z0389 Encounter for observation for other suspected diseases and conditions ruled out: Secondary | ICD-10-CM | POA: Diagnosis not present

## 2023-01-28 DIAGNOSIS — E88819 Insulin resistance, unspecified: Secondary | ICD-10-CM | POA: Diagnosis not present

## 2023-01-28 DIAGNOSIS — S60459A Superficial foreign body of unspecified finger, initial encounter: Secondary | ICD-10-CM

## 2023-01-28 DIAGNOSIS — Z79890 Hormone replacement therapy (postmenopausal): Secondary | ICD-10-CM | POA: Diagnosis not present

## 2023-01-28 DIAGNOSIS — M79644 Pain in right finger(s): Secondary | ICD-10-CM | POA: Diagnosis not present

## 2023-01-28 DIAGNOSIS — Z1329 Encounter for screening for other suspected endocrine disorder: Secondary | ICD-10-CM | POA: Diagnosis not present

## 2023-01-28 DIAGNOSIS — Z6841 Body Mass Index (BMI) 40.0 and over, adult: Secondary | ICD-10-CM | POA: Diagnosis not present

## 2023-04-22 DIAGNOSIS — B351 Tinea unguium: Secondary | ICD-10-CM | POA: Diagnosis not present

## 2023-04-29 DIAGNOSIS — J069 Acute upper respiratory infection, unspecified: Secondary | ICD-10-CM | POA: Diagnosis not present

## 2023-05-20 DIAGNOSIS — E291 Testicular hypofunction: Secondary | ICD-10-CM | POA: Diagnosis not present

## 2023-05-20 DIAGNOSIS — F988 Other specified behavioral and emotional disorders with onset usually occurring in childhood and adolescence: Secondary | ICD-10-CM | POA: Diagnosis not present

## 2023-05-20 DIAGNOSIS — F439 Reaction to severe stress, unspecified: Secondary | ICD-10-CM | POA: Diagnosis not present

## 2023-05-20 DIAGNOSIS — Z0000 Encounter for general adult medical examination without abnormal findings: Secondary | ICD-10-CM | POA: Diagnosis not present

## 2023-06-03 DIAGNOSIS — E291 Testicular hypofunction: Secondary | ICD-10-CM | POA: Diagnosis not present

## 2023-06-10 DIAGNOSIS — E291 Testicular hypofunction: Secondary | ICD-10-CM | POA: Diagnosis not present

## 2023-08-26 DIAGNOSIS — E291 Testicular hypofunction: Secondary | ICD-10-CM | POA: Diagnosis not present

## 2023-08-26 DIAGNOSIS — F988 Other specified behavioral and emotional disorders with onset usually occurring in childhood and adolescence: Secondary | ICD-10-CM | POA: Diagnosis not present

## 2023-08-26 DIAGNOSIS — F439 Reaction to severe stress, unspecified: Secondary | ICD-10-CM | POA: Diagnosis not present

## 2023-08-26 DIAGNOSIS — R7309 Other abnormal glucose: Secondary | ICD-10-CM | POA: Diagnosis not present

## 2023-09-13 DIAGNOSIS — S52531A Colles' fracture of right radius, initial encounter for closed fracture: Secondary | ICD-10-CM | POA: Diagnosis not present

## 2023-09-23 ENCOUNTER — Ambulatory Visit (HOSPITAL_COMMUNITY): Admit: 2023-09-23 | Payer: BC Managed Care – PPO | Admitting: Orthopedic Surgery

## 2023-09-23 DIAGNOSIS — S52571A Other intraarticular fracture of lower end of right radius, initial encounter for closed fracture: Secondary | ICD-10-CM | POA: Diagnosis not present

## 2023-09-23 SURGERY — OPEN REDUCTION INTERNAL FIXATION (ORIF) WRIST FRACTURE
Anesthesia: Regional | Site: Wrist | Laterality: Right

## 2023-10-07 DIAGNOSIS — S52501A Unspecified fracture of the lower end of right radius, initial encounter for closed fracture: Secondary | ICD-10-CM | POA: Diagnosis not present

## 2023-10-20 DIAGNOSIS — S52501D Unspecified fracture of the lower end of right radius, subsequent encounter for closed fracture with routine healing: Secondary | ICD-10-CM | POA: Diagnosis not present

## 2023-11-04 DIAGNOSIS — M546 Pain in thoracic spine: Secondary | ICD-10-CM | POA: Diagnosis not present

## 2023-11-04 DIAGNOSIS — M9902 Segmental and somatic dysfunction of thoracic region: Secondary | ICD-10-CM | POA: Diagnosis not present

## 2023-11-04 DIAGNOSIS — M9903 Segmental and somatic dysfunction of lumbar region: Secondary | ICD-10-CM | POA: Diagnosis not present

## 2023-11-04 DIAGNOSIS — M62830 Muscle spasm of back: Secondary | ICD-10-CM | POA: Diagnosis not present

## 2023-11-07 DIAGNOSIS — M9902 Segmental and somatic dysfunction of thoracic region: Secondary | ICD-10-CM | POA: Diagnosis not present

## 2023-11-07 DIAGNOSIS — M9903 Segmental and somatic dysfunction of lumbar region: Secondary | ICD-10-CM | POA: Diagnosis not present

## 2023-11-07 DIAGNOSIS — M546 Pain in thoracic spine: Secondary | ICD-10-CM | POA: Diagnosis not present

## 2023-11-07 DIAGNOSIS — M62830 Muscle spasm of back: Secondary | ICD-10-CM | POA: Diagnosis not present

## 2023-11-09 DIAGNOSIS — M9903 Segmental and somatic dysfunction of lumbar region: Secondary | ICD-10-CM | POA: Diagnosis not present

## 2023-11-09 DIAGNOSIS — M62830 Muscle spasm of back: Secondary | ICD-10-CM | POA: Diagnosis not present

## 2023-11-09 DIAGNOSIS — M546 Pain in thoracic spine: Secondary | ICD-10-CM | POA: Diagnosis not present

## 2023-11-09 DIAGNOSIS — M9902 Segmental and somatic dysfunction of thoracic region: Secondary | ICD-10-CM | POA: Diagnosis not present

## 2023-11-11 DIAGNOSIS — L218 Other seborrheic dermatitis: Secondary | ICD-10-CM | POA: Diagnosis not present

## 2023-11-11 DIAGNOSIS — D2239 Melanocytic nevi of other parts of face: Secondary | ICD-10-CM | POA: Diagnosis not present

## 2023-11-11 DIAGNOSIS — D225 Melanocytic nevi of trunk: Secondary | ICD-10-CM | POA: Diagnosis not present

## 2023-11-11 DIAGNOSIS — L814 Other melanin hyperpigmentation: Secondary | ICD-10-CM | POA: Diagnosis not present

## 2023-11-16 DIAGNOSIS — M9903 Segmental and somatic dysfunction of lumbar region: Secondary | ICD-10-CM | POA: Diagnosis not present

## 2023-11-16 DIAGNOSIS — M62830 Muscle spasm of back: Secondary | ICD-10-CM | POA: Diagnosis not present

## 2023-11-16 DIAGNOSIS — M9902 Segmental and somatic dysfunction of thoracic region: Secondary | ICD-10-CM | POA: Diagnosis not present

## 2023-11-16 DIAGNOSIS — M546 Pain in thoracic spine: Secondary | ICD-10-CM | POA: Diagnosis not present

## 2023-11-18 DIAGNOSIS — M9902 Segmental and somatic dysfunction of thoracic region: Secondary | ICD-10-CM | POA: Diagnosis not present

## 2023-11-18 DIAGNOSIS — M62830 Muscle spasm of back: Secondary | ICD-10-CM | POA: Diagnosis not present

## 2023-11-18 DIAGNOSIS — M9903 Segmental and somatic dysfunction of lumbar region: Secondary | ICD-10-CM | POA: Diagnosis not present

## 2023-11-18 DIAGNOSIS — M546 Pain in thoracic spine: Secondary | ICD-10-CM | POA: Diagnosis not present

## 2023-11-21 DIAGNOSIS — S52501D Unspecified fracture of the lower end of right radius, subsequent encounter for closed fracture with routine healing: Secondary | ICD-10-CM | POA: Diagnosis not present

## 2023-11-22 DIAGNOSIS — M546 Pain in thoracic spine: Secondary | ICD-10-CM | POA: Diagnosis not present

## 2023-11-22 DIAGNOSIS — M9902 Segmental and somatic dysfunction of thoracic region: Secondary | ICD-10-CM | POA: Diagnosis not present

## 2023-11-22 DIAGNOSIS — M9903 Segmental and somatic dysfunction of lumbar region: Secondary | ICD-10-CM | POA: Diagnosis not present

## 2023-11-22 DIAGNOSIS — M62830 Muscle spasm of back: Secondary | ICD-10-CM | POA: Diagnosis not present

## 2023-12-07 DIAGNOSIS — M546 Pain in thoracic spine: Secondary | ICD-10-CM | POA: Diagnosis not present

## 2023-12-07 DIAGNOSIS — M62830 Muscle spasm of back: Secondary | ICD-10-CM | POA: Diagnosis not present

## 2023-12-07 DIAGNOSIS — M9903 Segmental and somatic dysfunction of lumbar region: Secondary | ICD-10-CM | POA: Diagnosis not present

## 2023-12-07 DIAGNOSIS — M9902 Segmental and somatic dysfunction of thoracic region: Secondary | ICD-10-CM | POA: Diagnosis not present

## 2023-12-16 DIAGNOSIS — M62830 Muscle spasm of back: Secondary | ICD-10-CM | POA: Diagnosis not present

## 2023-12-16 DIAGNOSIS — M9903 Segmental and somatic dysfunction of lumbar region: Secondary | ICD-10-CM | POA: Diagnosis not present

## 2023-12-16 DIAGNOSIS — M546 Pain in thoracic spine: Secondary | ICD-10-CM | POA: Diagnosis not present

## 2023-12-16 DIAGNOSIS — M9902 Segmental and somatic dysfunction of thoracic region: Secondary | ICD-10-CM | POA: Diagnosis not present

## 2023-12-19 DIAGNOSIS — M79604 Pain in right leg: Secondary | ICD-10-CM | POA: Diagnosis not present

## 2023-12-22 DIAGNOSIS — S52501D Unspecified fracture of the lower end of right radius, subsequent encounter for closed fracture with routine healing: Secondary | ICD-10-CM | POA: Diagnosis not present

## 2024-01-06 DIAGNOSIS — M62830 Muscle spasm of back: Secondary | ICD-10-CM | POA: Diagnosis not present

## 2024-01-06 DIAGNOSIS — M546 Pain in thoracic spine: Secondary | ICD-10-CM | POA: Diagnosis not present

## 2024-01-06 DIAGNOSIS — M9903 Segmental and somatic dysfunction of lumbar region: Secondary | ICD-10-CM | POA: Diagnosis not present

## 2024-01-06 DIAGNOSIS — M9902 Segmental and somatic dysfunction of thoracic region: Secondary | ICD-10-CM | POA: Diagnosis not present

## 2024-01-20 DIAGNOSIS — Z6841 Body Mass Index (BMI) 40.0 and over, adult: Secondary | ICD-10-CM | POA: Diagnosis not present

## 2024-01-20 DIAGNOSIS — M5416 Radiculopathy, lumbar region: Secondary | ICD-10-CM | POA: Diagnosis not present

## 2024-01-27 DIAGNOSIS — M5416 Radiculopathy, lumbar region: Secondary | ICD-10-CM | POA: Diagnosis not present

## 2024-02-03 DIAGNOSIS — M5416 Radiculopathy, lumbar region: Secondary | ICD-10-CM | POA: Diagnosis not present

## 2024-02-17 DIAGNOSIS — M5416 Radiculopathy, lumbar region: Secondary | ICD-10-CM | POA: Diagnosis not present

## 2024-02-24 DIAGNOSIS — M546 Pain in thoracic spine: Secondary | ICD-10-CM | POA: Diagnosis not present

## 2024-02-24 DIAGNOSIS — M9903 Segmental and somatic dysfunction of lumbar region: Secondary | ICD-10-CM | POA: Diagnosis not present

## 2024-02-24 DIAGNOSIS — M62830 Muscle spasm of back: Secondary | ICD-10-CM | POA: Diagnosis not present

## 2024-02-24 DIAGNOSIS — M9902 Segmental and somatic dysfunction of thoracic region: Secondary | ICD-10-CM | POA: Diagnosis not present

## 2024-03-02 DIAGNOSIS — F988 Other specified behavioral and emotional disorders with onset usually occurring in childhood and adolescence: Secondary | ICD-10-CM | POA: Diagnosis not present

## 2024-03-02 DIAGNOSIS — E291 Testicular hypofunction: Secondary | ICD-10-CM | POA: Diagnosis not present

## 2024-03-16 DIAGNOSIS — Z6841 Body Mass Index (BMI) 40.0 and over, adult: Secondary | ICD-10-CM | POA: Diagnosis not present

## 2024-03-16 DIAGNOSIS — M5416 Radiculopathy, lumbar region: Secondary | ICD-10-CM | POA: Diagnosis not present

## 2024-03-16 DIAGNOSIS — E291 Testicular hypofunction: Secondary | ICD-10-CM | POA: Diagnosis not present

## 2024-06-12 ENCOUNTER — Other Ambulatory Visit: Payer: Self-pay | Admitting: Family Medicine

## 2024-06-13 ENCOUNTER — Other Ambulatory Visit: Payer: Self-pay | Admitting: Family Medicine
